# Patient Record
Sex: Female | Born: 1990 | Race: Black or African American | Hispanic: No | Marital: Single | Smoking: Current some day smoker
Health system: Southern US, Community
[De-identification: ages and names within clinical notes are randomized; demographics above are authoritative.]

## PROBLEM LIST (undated history)

## (undated) ENCOUNTER — Inpatient Hospital Stay: Payer: Self-pay

## (undated) DIAGNOSIS — D649 Anemia, unspecified: Secondary | ICD-10-CM

## (undated) DIAGNOSIS — Z3490 Encounter for supervision of normal pregnancy, unspecified, unspecified trimester: Secondary | ICD-10-CM

## (undated) DIAGNOSIS — J45909 Unspecified asthma, uncomplicated: Secondary | ICD-10-CM

## (undated) HISTORY — DX: Encounter for supervision of normal pregnancy, unspecified, unspecified trimester: Z34.90

---
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
## 2012-02-03 ENCOUNTER — Emergency Department: Payer: Self-pay | Admitting: Unknown Physician Specialty

## 2013-09-27 ENCOUNTER — Observation Stay: Payer: Self-pay

## 2013-09-27 LAB — URINALYSIS, COMPLETE
BLOOD: NEGATIVE
Bilirubin,UR: NEGATIVE
Glucose,UR: NEGATIVE mg/dL (ref 0–75)
Ketone: NEGATIVE
NITRITE: NEGATIVE
PROTEIN: NEGATIVE
Ph: 6 (ref 4.5–8.0)
RBC,UR: 1 /HPF (ref 0–5)
SPECIFIC GRAVITY: 1.012 (ref 1.003–1.030)
Squamous Epithelial: 17
WBC UR: 5 /HPF (ref 0–5)

## 2013-10-03 IMAGING — CR DG TOE 5TH LEFT
1 series · 3 of 3 positions shown · non-contrast
Comparison: none

REASON FOR EXAM: injury
COMMENTS:

PROCEDURE:     DXR - DXR TOE 5TH DIGIT LEFT FOOT  - February 03, 2012 [DATE]
RESULT:     Three views of the left fifth toe are submitted. The bones are
adequately mineralized. There is no evidence of an acute fracture nor
dislocation. The overlying soft tissues are normal in appearance.

[Series 1: x toes ap left · 0.14mm/px · 3 of 3 slices shown]
[im 1/3]
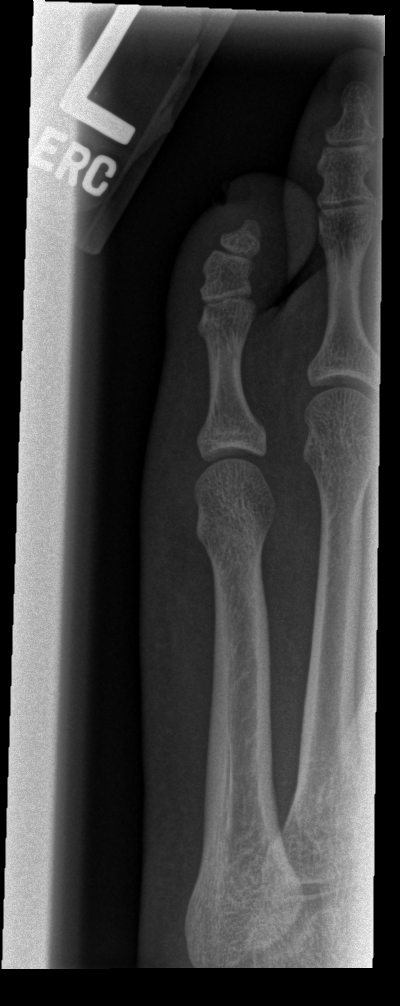
[im 2/3]
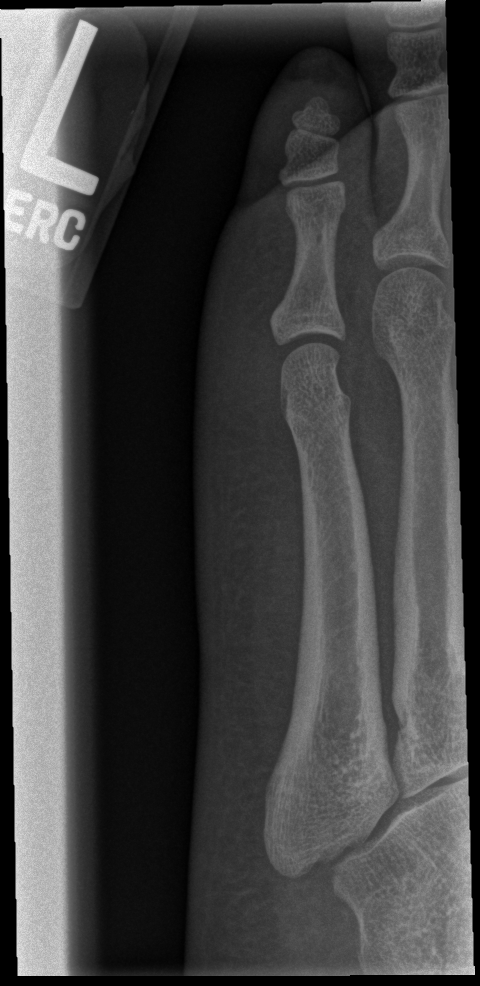
[im 3/3]
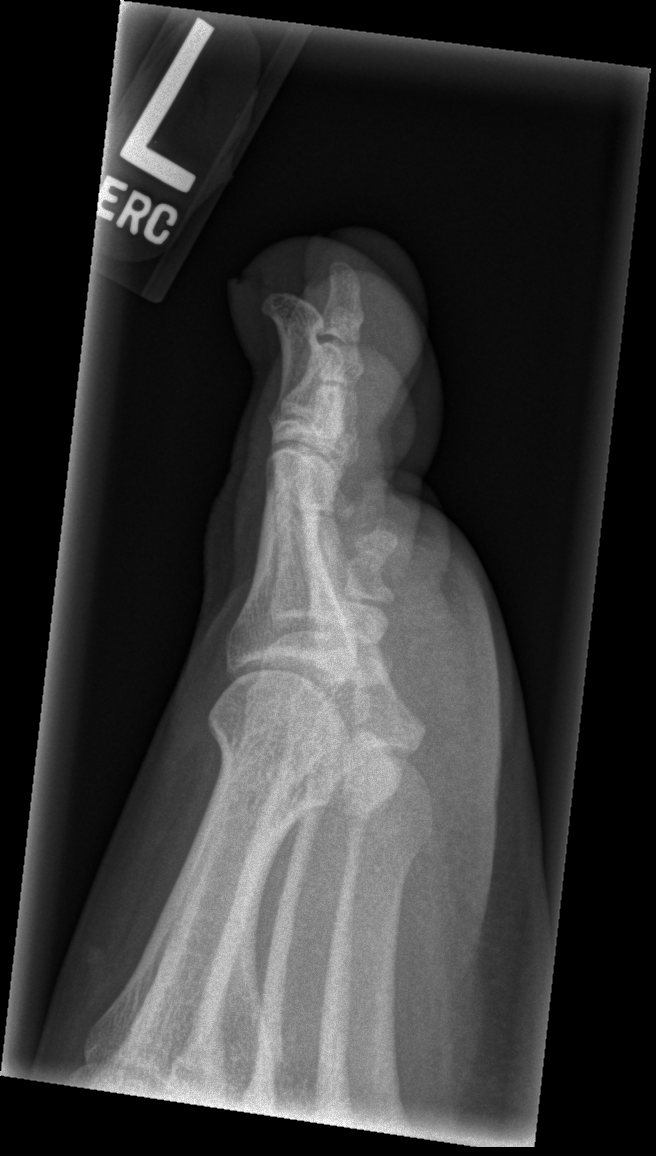

[3 of 3 positions shown; findings below may reference images not displayed]

IMPRESSION: There is no acute bony abnormality of the left fifth digit.
No soft tissue foreign bodies are demonstrated.

[REDACTED]

## 2013-10-04 IMAGING — CR RIGHT FOOT COMPLETE - 3+ VIEW
1 series · 3 of 3 positions shown · non-contrast
Comparison: none

REASON FOR EXAM: injury fall fb
COMMENTS:

[Series 1: ap · 0.17mm/px · 3 of 3 slices shown]
[im 1/3]
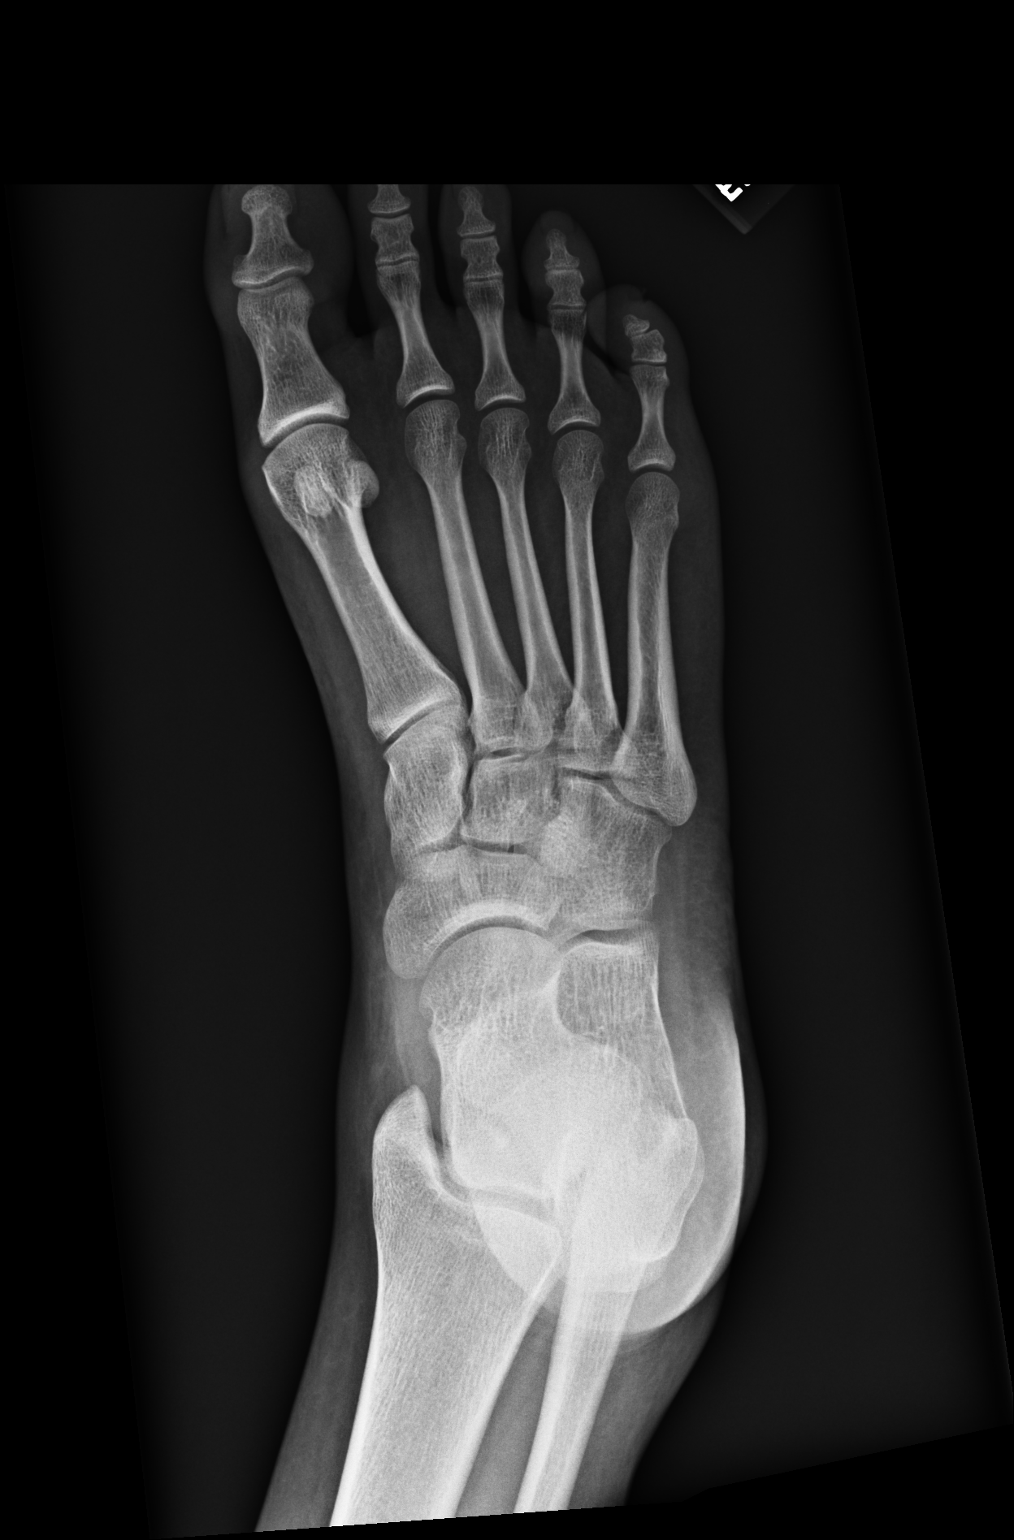
[im 2/3]
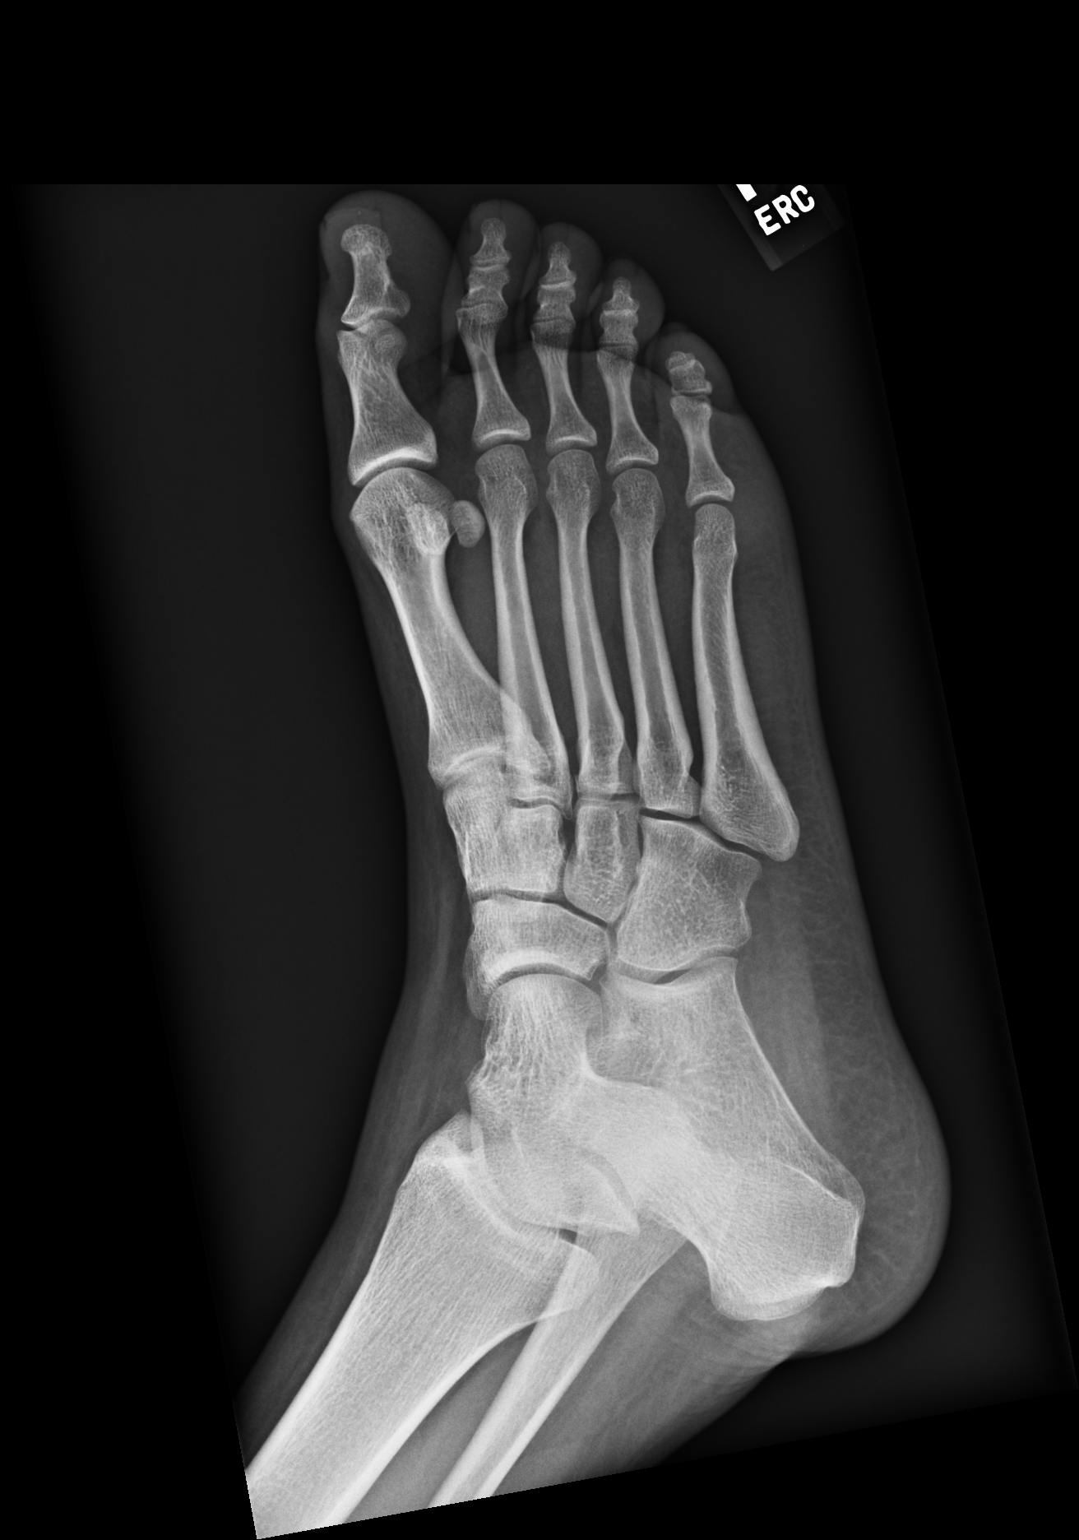
[im 3/3]
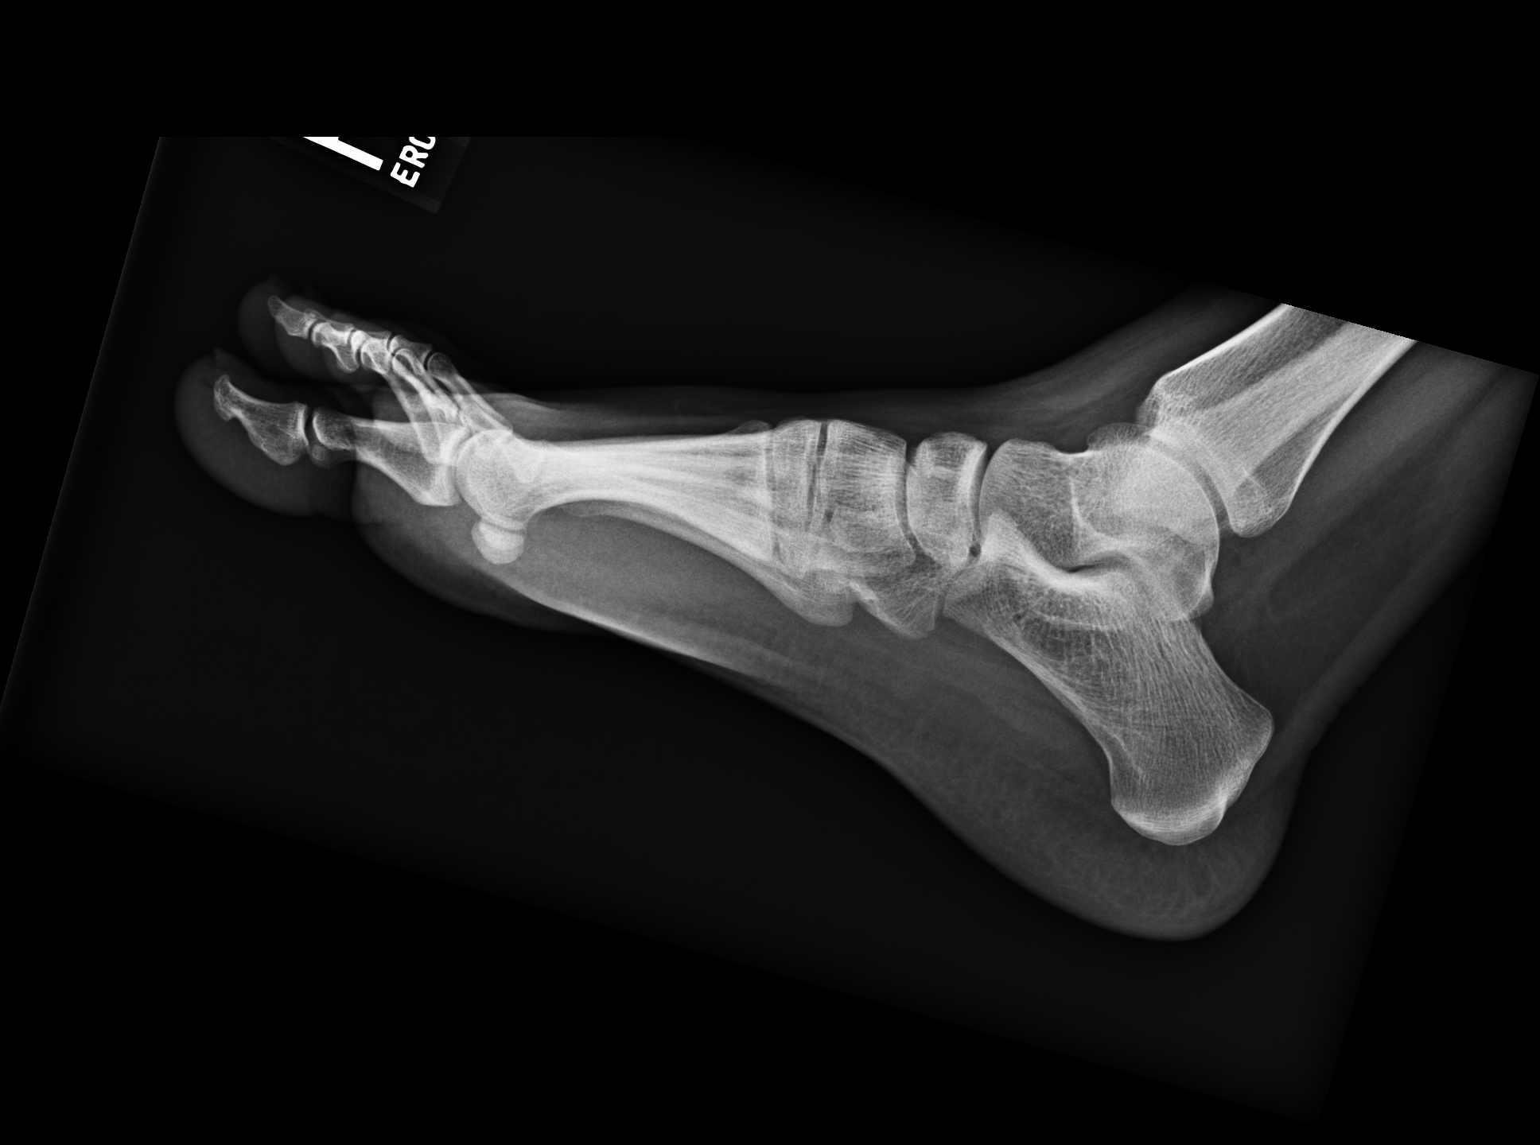

[3 of 3 positions shown; findings below may reference images not displayed]

PROCEDURE:     DXR - DXR FOOT RT COMPLETE W/OBLIQUES  - February 04, 2012  [DATE]

RESULT:     Three views of the right foot reveal the bones to be adequately
mineralized. There is no evidence of an acute fracture nor dislocation. No
radiopaque foreign bodies or soft tissue gas collections are demonstrated.
IMPRESSION: There is no acute bony abnormality of the right foot.

[REDACTED]

## 2014-10-26 NOTE — H&P (Signed)
L&D Evaluation:  History:  HPI 24 year old G2 106P1001 with EDC=08 Nov 2013 by a 18 week ultrasound per patient ( and 12 May per LMP) presents at 34 weeks with c/o constant pressure in lower abdomen x 2 days. Denies ctxs, VB, dysuria, vaginal discharge, diarrhea. Baby active. [facility]. No prenatal records available. History peer the patient and records from her C-section 2 years ago for [facility]. States that she has not had any problems thus far with the pregnancy (other than being treated for BV) and that she plans to make an appt to start her care soon her in [place].   Presents with lower abdominal pressure   Patient's Medical History Denies CMI, but mention of bipolar disorder in old chart   Patient's Surgical History Previous C-Section   Medications Pre Serbiaatal Vitamins  Tylenol (Acetaminophen)  antibiotic for BV   Allergies Amoxicillin (unknown reaction)   Social History Denies tobacco or drug use (past hx positive for both)   Family History Non-Contributory   ROS:  ROS see HPI   Exam:  Vital Signs 129/72   Urine Protein UA negative   General no apparent distress   Mental Status clear   Abdomen gravid, non-tender   Estimated Fetal Weight 4#9oz on US/ posterior and fundal placenta   Fetal Position cephalic   Edema no edema   Pelvic no external lesions, closed/L/-3 and ballotable   Mebranes Intact   FHT normal rate with no decels, 135 with accels to 150s-170s   FHT Description Cat 1   Ucx absent   Skin dry   Impression:  Impression reactive NST, IUP at 34 weeks with no evidence of labor or UTI   Plan:  Plan DC home . [facility] .   Electronic Signatures: Trinna BalloonGutierrez, Yanis Juma L (CNM)  (Signed 12-Apr-15 20:41)  Authored: L&D Evaluation   Last Updated: 12-Apr-15 20:41 by Trinna BalloonGutierrez, Lavena Loretto L (CNM)

## 2014-11-19 ENCOUNTER — Emergency Department
Admission: EM | Admit: 2014-11-19 | Discharge: 2014-11-20 | Disposition: A | Discharge status: Home / Self Care | Payer: Medicaid Other | Attending: Emergency Medicine | Admitting: Emergency Medicine

## 2014-11-19 ENCOUNTER — Encounter: Payer: Self-pay | Admitting: *Deleted

## 2014-11-19 DIAGNOSIS — F17210 Nicotine dependence, cigarettes, uncomplicated: Secondary | ICD-10-CM | POA: Insufficient documentation

## 2014-11-19 DIAGNOSIS — Z3A12 12 weeks gestation of pregnancy: Secondary | ICD-10-CM | POA: Insufficient documentation

## 2014-11-19 DIAGNOSIS — O99331 Smoking (tobacco) complicating pregnancy, first trimester: Secondary | ICD-10-CM | POA: Insufficient documentation

## 2014-11-19 DIAGNOSIS — F10929 Alcohol use, unspecified with intoxication, unspecified: Secondary | ICD-10-CM | POA: Insufficient documentation

## 2014-11-19 DIAGNOSIS — O99311 Alcohol use complicating pregnancy, first trimester: Secondary | ICD-10-CM | POA: Insufficient documentation

## 2014-11-19 DIAGNOSIS — F1410 Cocaine abuse, uncomplicated: Secondary | ICD-10-CM | POA: Insufficient documentation

## 2014-11-19 DIAGNOSIS — Z789 Other specified health status: Secondary | ICD-10-CM

## 2014-11-19 DIAGNOSIS — Z7289 Other problems related to lifestyle: Secondary | ICD-10-CM

## 2014-11-19 DIAGNOSIS — F1210 Cannabis abuse, uncomplicated: Secondary | ICD-10-CM | POA: Insufficient documentation

## 2014-11-19 DIAGNOSIS — O99321 Drug use complicating pregnancy, first trimester: Secondary | ICD-10-CM | POA: Insufficient documentation

## 2014-11-19 LAB — CBC
HCT: 34.4 % — ABNORMAL LOW (ref 35.0–47.0)
Hemoglobin: 11.2 g/dL — ABNORMAL LOW (ref 12.0–16.0)
MCH: 25.8 pg — ABNORMAL LOW (ref 26.0–34.0)
MCHC: 32.6 g/dL (ref 32.0–36.0)
MCV: 79.2 fL — ABNORMAL LOW (ref 80.0–100.0)
Platelets: 350 10*3/uL (ref 150–440)
RBC: 4.34 MIL/uL (ref 3.80–5.20)
RDW: 16.2 % — ABNORMAL HIGH (ref 11.5–14.5)
WBC: 8.8 10*3/uL (ref 3.6–11.0)

## 2014-11-19 LAB — TYPE AND SCREEN
ABO/RH(D): O POS
Antibody Screen: NEGATIVE

## 2014-11-19 LAB — COMPREHENSIVE METABOLIC PANEL
ALBUMIN: 3.7 g/dL (ref 3.5–5.0)
ALK PHOS: 48 U/L (ref 38–126)
ALT: 20 U/L (ref 14–54)
AST: 23 U/L (ref 15–41)
Anion gap: 11 (ref 5–15)
BUN: 12 mg/dL (ref 6–20)
CALCIUM: 8.6 mg/dL — AB (ref 8.9–10.3)
CO2: 20 mmol/L — AB (ref 22–32)
Chloride: 109 mmol/L (ref 101–111)
Creatinine, Ser: 1.2 mg/dL — ABNORMAL HIGH (ref 0.44–1.00)
GFR calc Af Amer: >60 mL/min (ref >60)
GFR calc non Af Amer: >60 mL/min (ref >60)
Glucose, Bld: 104 mg/dL — ABNORMAL HIGH (ref 65–99)
Potassium: 3.3 mmol/L — ABNORMAL LOW (ref 3.5–5.1)
SODIUM: 140 mmol/L (ref 135–145)
Total Bilirubin: 0.1 mg/dL — ABNORMAL LOW (ref 0.3–1.2)
Total Protein: 7.4 g/dL (ref 6.5–8.1)

## 2014-11-19 LAB — ETHANOL: Alcohol, Ethyl (B): 359 mg/dL (ref <5)

## 2014-11-19 LAB — SALICYLATE LEVEL: Salicylate Lvl: <4 mg/dL (ref 2.8–30.0)

## 2014-11-19 LAB — ACETAMINOPHEN LEVEL

## 2014-11-19 NOTE — ED Provider Notes (Signed)
[facility] Emergency Department Provider Note  Time seen: 10:55 PM  I have reviewed the triage vital signs and the nursing notes.   HISTORY  Chief Complaint Medical Clearance and Alcohol Intoxication    HPI Barbara Boone is a 24 y.o. female with no known past medical history presents the emergency department by [facility] police after being arrested for driving while intoxicated. According to the police report the patient over for driving erratically, patient admits cocaine and [place] use. The patient is 2 to 4 months pregnant. They brought the patient into the emergency department under an involuntary commitment for further evaluation and treatment of substance abuse. Upon arrival the patient appears intoxicated with slurred speech, initially yelling. Denies any medical problems. Denies vaginal bleeding or fluid leakage. Denies any OB care. Patient is not exactly sure how far along she is. Denies any abdominal pain.    History reviewed. No pertinent past medical history.  There are no active problems to display for this patient.   History reviewed. No pertinent past surgical history.  No current outpatient prescriptions on file.  Allergies Review of patient's allergies indicates no known allergies.  History reviewed. No pertinent family history.  Social History History  Substance Use Topics  . Smoking status: Current Some Day Smoker    Types: Cigarettes  . Smokeless tobacco: Never Used  . Alcohol Use: Yes     Comment: Unknown, presently intoxicated w/ ETOH    Review of Systems Constitutional: Negative for fever. Cardiovascular: Negative for chest pain. Respiratory: Negative for shortness of breath. Gastrointestinal: Negative for abdominal pai Genitourinary: Negative for vaginal bleeding. 10-point ROS otherwise negative.  ____________________________________________   PHYSICAL EXAM:  VITAL SIGNS: ED Triage Vitals  Enc Vitals Group    BP 11/19/14 2130 130/91 mmHg     Pulse Rate 11/19/14 2130 115     Resp 11/19/14 2130 20     Temp 11/19/14 2130 98.2 F (36.8 C)     Temp Source 11/19/14 2130 Axillary     SpO2 11/19/14 2130 100 %     Weight 11/19/14 2130 190 lb (86.183 kg)     Height 11/19/14 2130  (1.702 m)     Head Cir --      Peak Flow --      Pain Score --      Pain Loc --      Pain Edu? --      Excl. in GC? --     Constitutional: Alert and oriented. Intoxicated with slurred speech. ENT   Head: Normocephalic and atraumatic   Mouth/Throat: Mucous membranes are moist. Cardiovascular: Normal rate, regular rhythm. Respiratory: Normal respiratory effort without tachypnea nor retractions. Breath sounds are clear  Gastrointestinal: Soft and nontender. No distention.   Musculoskeletal: Nontender with normal range of motion in all extremities.  Neurologic:  Normal speech and language. No gross focal neurologic deficits  Skin:  Skin is warm, dry and intact.  Psychiatric: Slurred speech, appears intoxicated, patient is cooperative at this time.  ____________________________________________   INITIAL IMPRESSION / ASSESSMENT AND PLAN / ED COURSE  Pertinent labs & imaging results that were available during my care of the patient were reviewed by me and considered in my medical decision making (see chart for details).  Patient here following a DWI, likely pregnant, likely intoxicated. We will keep the patient under an involuntary commitment until the patient can be properly evaluated by psychiatry, and hopefully will be open to substance abuse treatment.  ____________________________________________  FINAL CLINICAL IMPRESSION(S) / ED DIAGNOSES  Substance abuse   Minna AntisKevin Alexandera Kuntzman, MD 11/19/14 2300

## 2014-11-19 NOTE — ED Notes (Addendum)
Pt threw hat out of toilet and refused to give urine sample stating "I am not being drug tested and taken to jail for it"

## 2014-11-19 NOTE — ED Notes (Signed)
Pt in [employer] custody for forensic blood draw. Pt is hysterically crying and inconsolable. Pt smells strongly of ETOH and admits to use. Pt reports that she is pregnant.

## 2014-11-20 ENCOUNTER — Encounter: Payer: Self-pay | Admitting: *Deleted

## 2014-11-20 LAB — URINE DRUG SCREEN, QUALITATIVE (ARMC ONLY)
Amphetamines, Ur Screen: NOT DETECTED
Barbiturates, Ur Screen: NOT DETECTED
Benzodiazepine, Ur Scrn: NOT DETECTED
CANNABINOID 50 NG, UR ~~LOC~~: POSITIVE — AB
COCAINE METABOLITE, UR ~~LOC~~: POSITIVE — AB
MDMA (ECSTASY) UR SCREEN: NOT DETECTED
METHADONE SCREEN, URINE: NOT DETECTED
OPIATE, UR SCREEN: NOT DETECTED
Phencyclidine (PCP) Ur S: NOT DETECTED
Tricyclic, Ur Screen: NOT DETECTED

## 2014-11-20 LAB — URINALYSIS COMPLETE WITH MICROSCOPIC (ARMC ONLY)
BACTERIA UA: NONE SEEN
BILIRUBIN URINE: NEGATIVE
GLUCOSE, UA: NEGATIVE mg/dL
HGB URINE DIPSTICK: NEGATIVE
LEUKOCYTES UA: NEGATIVE
NITRITE: NEGATIVE
PROTEIN: NEGATIVE mg/dL
SPECIFIC GRAVITY, URINE: 1.017 (ref 1.005–1.030)
pH: 5 (ref 5.0–8.0)

## 2014-11-20 LAB — ABO/RH: ABO/RH(D): O POS

## 2014-11-20 LAB — HCG, QUANTITATIVE, PREGNANCY: HCG, BETA CHAIN, QUANT, S: 145985 m[IU]/mL — AB (ref <5)

## 2014-11-20 LAB — POCT PREGNANCY, URINE: PREG TEST UR: POSITIVE — AB

## 2014-11-20 MED ORDER — ACETAMINOPHEN 325 MG PO TABS
ORAL_TABLET | ORAL | Status: AC
  Administered 2014-11-20: 650 mg via ORAL
  Filled 2014-11-20: qty 2

## 2014-11-20 MED ORDER — PRENATAL MULTIVITAMIN CH
1.0000 | ORAL_TABLET | Freq: Every day | ORAL | Status: DC
  Administered 2014-11-20: 1 via ORAL
  Filled 2014-11-20: qty 1

## 2014-11-20 MED ORDER — ACETAMINOPHEN 325 MG PO TABS
650.0000 mg | ORAL_TABLET | Freq: Once | ORAL | Status: AC
  Administered 2014-11-20: 650 mg via ORAL

## 2014-11-20 NOTE — ED Notes (Signed)
BEHAVIORAL HEALTH ROUNDING Patient sleeping: No. Patient alert and oriented: yes Behavior appropriate: Yes.  ; If no, describe:  Nutrition and fluids offered: Yes  Toileting and hygiene offered: Yes  Sitter present: no Law enforcement present: Yes ODS 

## 2014-11-20 NOTE — ED Notes (Signed)
Psych / TTS consult completed plan to discharge patient/Will rescind the IVC

## 2014-11-20 NOTE — Discharge Instructions (Signed)
Please seek medical attention for any high fevers, chest pain, shortness of breath, change in behavior, persistent vomiting, bloody stool or any other new or concerning symptoms.  Alcohol Use Disorder Alcohol use disorder is a mental disorder. It is not a one-time incident of heavy drinking. Alcohol use disorder is the excessive and uncontrollable use of alcohol over time that leads to problems with functioning in one or more areas of daily living. People with this disorder risk harming themselves and others when they drink to excess. Alcohol use disorder also can cause other mental disorders, such as mood and anxiety disorders, and serious physical problems. People with alcohol use disorder often misuse other drugs.  Alcohol use disorder is common and widespread. Some people with this disorder drink alcohol to cope with or escape from negative life events. Others drink to relieve chronic pain or symptoms of mental illness. People with a family history of alcohol use disorder are at higher risk of losing control and using alcohol to excess.  SYMPTOMS  Signs and symptoms of alcohol use disorder may include the following:   Consumption ofalcohol inlarger amounts or over a longer period of time than intended.  Multiple unsuccessful attempts to cutdown or control alcohol use.   A great deal of time spent obtaining alcohol, using alcohol, or recovering from the effects of alcohol (hangover).  A strong desire or urge to use alcohol (cravings).   Continued use of alcohol despite problems at work, school, or home because of alcohol use.   Continued use of alcohol despite problems in relationships because of alcohol use.  Continued use of alcohol in situations when it is physically hazardous, such as driving a car.  Continued use of alcohol despite awareness of a physical or psychological problem that is likely related to alcohol use. Physical problems related to alcohol use can involve the brain,  heart, liver, stomach, and intestines. Psychological problems related to alcohol use include intoxication, depression, anxiety, psychosis, delirium, and dementia.   The need for increased amounts of alcohol to achieve the same desired effect, or a decreased effect from the consumption of the same amount of alcohol (tolerance).  Withdrawal symptoms upon reducing or stopping alcohol use, or alcohol use to reduce or avoid withdrawal symptoms. Withdrawal symptoms include:  Racing heart.  Hand tremor.  Difficulty sleeping.  Nausea.  Vomiting.  Hallucinations.  Restlessness.  Seizures. DIAGNOSIS Alcohol use disorder is diagnosed through an assessment by your health care provider. Your health care provider may start by asking three or four questions to screen for excessive or problematic alcohol use. To confirm a diagnosis of alcohol use disorder, at least two symptoms must be present within a 5872-month period. The severity of alcohol use disorder depends on the number of symptoms:  Mild--two or three.  Moderate--four or five.  Severe--six or more. Your health care provider may perform a physical exam or use results from lab tests to see if you have physical problems resulting from alcohol use. Your health care provider may refer you to a mental health professional for evaluation. TREATMENT  Some people with alcohol use disorder are able to reduce their alcohol use to low-risk levels. Some people with alcohol use disorder need to quit drinking alcohol. When necessary, mental health professionals with specialized training in substance use treatment can help. Your health care provider can help you decide how severe your alcohol use disorder is and what type of treatment you need. The following forms of treatment are available:   Detoxification.  Detoxification involves the use of prescription medicines to prevent alcohol withdrawal symptoms in the first week after quitting. This is important  for people with a history of symptoms of withdrawal and for heavy drinkers who are likely to have withdrawal symptoms. Alcohol withdrawal can be dangerous and, in severe cases, cause death. Detoxification is usually provided in a hospital or in-patient substance use treatment facility.  Counseling or talk therapy. Talk therapy is provided by substance use treatment counselors. It addresses the reasons people use alcohol and ways to keep them from drinking again. The goals of talk therapy are to help people with alcohol use disorder find healthy activities and ways to cope with life stress, to identify and avoid triggers for alcohol use, and to handle cravings, which can cause relapse.  Medicines.Different medicines can help treat alcohol use disorder through the following actions:  Decrease alcohol cravings.  Decrease the positive reward response felt from alcohol use.  Produce an uncomfortable physical reaction when alcohol is used (aversion therapy).  Support groups. Support groups are run by people who have quit drinking. They provide emotional support, advice, and guidance. These forms of treatment are often combined. Some people with alcohol use disorder benefit from intensive combination treatment provided by specialized substance use treatment centers. Both inpatient and outpatient treatment programs are available. Document Released: 07/12/2004 Document Revised: 10/19/2013 Document Reviewed: 09/11/2012 [facility] Patient Information 2015 [employer] [place]. This information is not intended to replace advice given to you by your health care provider. Make sure you discuss any questions you have with your health care provider.

## 2014-11-20 NOTE — BH Assessment (Signed)
LATE ENTRY-------Spoke with pt. mother (Angelia) via phone. Pt. provided Clinical research associatewriter the number. It was dialed and conversation was had in front of the pt.  Writer inquired about what she knows about what happened that lead to the pt. being brought to the ER. Mother stated, "All I know, is the police brought her keys to the house and said she was acting crazy. She was banging her head on the grown and fighting us. She was taking to the ER to get checked out." Writer further asked the mother, if she had concerns for the pt. safety and she initially stated yes. The nature of her concerns were related to her being pregnant and her substance use. Pt. has a 22-year-old daughter that is in her custody. Writer asked about concerns of safety for her. Mother reported none, due to being the primary caregiver for the pt. When pt. is "out drinking and carrying on, I have her (patient's daughter)."

## 2014-11-20 NOTE — ED Notes (Signed)
BEHAVIORAL HEALTH ROUNDING Patient sleeping: Yes.   Patient alert and oriented: yes Behavior appropriate: Yes.  ;  Nutrition and fluids offered: Yes  Toileting and hygiene offered: Yes  Sitter present: yes Law enforcement present: Yes  

## 2014-11-20 NOTE — ED Notes (Signed)
BEHAVIORAL HEALTH ROUNDING Patient sleeping: Yes.   Patient alert and oriented: not applicable Behavior appropriate: Yes.  ; If no, describe:   Nutrition and fluids offered: No Toileting and hygiene offered: No Sitter present: no Law enforcement present: Yes  and ODS  ENVIRONMENTAL ASSESSMENT Potentially harmful objects out of patient reach: Yes.   Personal belongings secured: Yes.   Patient dressed in hospital provided attire only: Yes.   Plastic bags out of patient reach: Yes.   Patient care equipment (cords, cables, call bells, lines, and drains) shortened, removed, or accounted for: Yes.   Equipment and supplies removed from bottom of stretcher: Yes.   Potentially toxic materials out of patient reach: Yes.   Sharps container removed or out of patient reach: Yes.    

## 2014-11-20 NOTE — ED Notes (Signed)
Pt given dinner tray at this time, sitting up in bed eating, tolerating well. Calm cooperative and no issues at this time.

## 2014-11-20 NOTE — ED Notes (Signed)
BEHAVIORAL HEALTH ROUNDING Patient sleeping: Yes.   Patient alert and oriented: not applicable Behavior appropriate: Yes.  ; If no, describe:   Nutrition and fluids offered: No Toileting and hygiene offered: No Sitter present: no Law enforcement present: Yes  and ODS    

## 2014-11-20 NOTE — [facility] Counselor (Signed)
LATE ENTRY------Per request of Psych MD, Dr. Challa, writer provided the pt. with information and instructions on how to access Out Pt. Mental Health Treatment ([facility])   
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
## 2014-11-20 NOTE — ED Provider Notes (Signed)
-----------------------------------------   4:09 PM on 11/20/2014 -----------------------------------------  Psychiatry has seen patient. They have rescinded the IVC. Patient will follow up with [facility].  Phineas SemenGraydon Meliana Canner, MD 11/20/14 [phone]

## 2014-11-20 NOTE — ED Notes (Signed)
CSW speaking with pt at present

## 2014-11-20 NOTE — ED Notes (Signed)

## 2014-11-20 NOTE — ED Notes (Signed)
Visitor with pt.

## 2014-11-20 NOTE — ED Provider Notes (Signed)
-----------------------------------------   7:14 AM on 11/20/2014 -----------------------------------------   BP 130/91 mmHg  Pulse 115  Temp(Src) 98.2 F (36.8 C) (Axillary)  Resp 20  Ht 5\' 7"  (1.702 m)  Wt 190 lb (86.183 kg)  BMI 29.75 kg/m2  SpO2 100%  LMP  (LMP Unknown)  The patient had no acute events since last update.  Calm and cooperative at this time.  Disposition is pending per Psychiatry/Behavioral Medicine team recommendations.     Sharman CheekPhillip Tevis Conger, MD 11/20/14 [phone]

## 2014-11-20 NOTE — ED Notes (Signed)
Psychiatrist with pt at present for consult.

## 2014-11-20 NOTE — ED Notes (Signed)
BEHAVIORAL HEALTH ROUNDING Patient sleeping: No. Patient alert and oriented: yes Behavior appropriate: Yes.  ; If no, describe:  Nutrition and fluids offered: Yes  Toileting and hygiene offered: Yes  Sitter present: no Law enforcement present: Yes ODs 

## 2014-11-20 NOTE — ED Notes (Signed)
Pt to toilet to provided urine specimen

## 2015-02-21 ENCOUNTER — Encounter: Payer: Self-pay | Admitting: Emergency Medicine

## 2015-02-21 ENCOUNTER — Emergency Department: Payer: Medicaid Other

## 2015-02-21 ENCOUNTER — Emergency Department
Admission: EM | Admit: 2015-02-21 | Discharge: 2015-02-21 | Disposition: A | Discharge status: Home / Self Care | Payer: Medicaid Other | Attending: Emergency Medicine | Admitting: Emergency Medicine

## 2015-02-21 DIAGNOSIS — Z3A00 Weeks of gestation of pregnancy not specified: Secondary | ICD-10-CM | POA: Insufficient documentation

## 2015-02-21 DIAGNOSIS — Y9389 Activity, other specified: Secondary | ICD-10-CM | POA: Diagnosis not present

## 2015-02-21 DIAGNOSIS — Y998 Other external cause status: Secondary | ICD-10-CM | POA: Diagnosis not present

## 2015-02-21 DIAGNOSIS — O99330 Smoking (tobacco) complicating pregnancy, unspecified trimester: Secondary | ICD-10-CM | POA: Insufficient documentation

## 2015-02-21 DIAGNOSIS — O9A219 Injury, poisoning and certain other consequences of external causes complicating pregnancy, unspecified trimester: Secondary | ICD-10-CM | POA: Insufficient documentation

## 2015-02-21 DIAGNOSIS — Y92410 Unspecified street and highway as the place of occurrence of the external cause: Secondary | ICD-10-CM | POA: Insufficient documentation

## 2015-02-21 DIAGNOSIS — F17210 Nicotine dependence, cigarettes, uncomplicated: Secondary | ICD-10-CM | POA: Diagnosis not present

## 2015-02-21 DIAGNOSIS — Z880 Allergy status to penicillin: Secondary | ICD-10-CM | POA: Diagnosis not present

## 2015-02-21 DIAGNOSIS — S8012XA Contusion of left lower leg, initial encounter: Secondary | ICD-10-CM | POA: Diagnosis not present

## 2015-02-21 LAB — COMPREHENSIVE METABOLIC PANEL
ALK PHOS: 56 U/L (ref 38–126)
ALT: 17 U/L (ref 14–54)
AST: 31 U/L (ref 15–41)
Albumin: 3.2 g/dL — ABNORMAL LOW (ref 3.5–5.0)
Anion gap: 8 (ref 5–15)
BILIRUBIN TOTAL: 0.3 mg/dL (ref 0.3–1.2)
BUN: 7 mg/dL (ref 6–20)
CALCIUM: 8.5 mg/dL — AB (ref 8.9–10.3)
CO2: 22 mmol/L (ref 22–32)
CREATININE: 0.55 mg/dL (ref 0.44–1.00)
Chloride: 105 mmol/L (ref 101–111)
Glucose, Bld: 118 mg/dL — ABNORMAL HIGH (ref 65–99)
Potassium: 3.2 mmol/L — ABNORMAL LOW (ref 3.5–5.1)
Sodium: 135 mmol/L (ref 135–145)
Total Protein: 6.6 g/dL (ref 6.5–8.1)

## 2015-02-21 LAB — CBC WITH DIFFERENTIAL/PLATELET
Basophils Absolute: 0 10*3/uL (ref 0–0.1)
Basophils Relative: 0 %
Eosinophils Absolute: 0.1 10*3/uL (ref 0–0.7)
Eosinophils Relative: 1 %
HCT: 30.7 % — ABNORMAL LOW (ref 35.0–47.0)
HEMOGLOBIN: 10 g/dL — AB (ref 12.0–16.0)
LYMPHS ABS: 1.8 10*3/uL (ref 1.0–3.6)
LYMPHS PCT: 21 %
MCH: 26.6 pg (ref 26.0–34.0)
MCHC: 32.5 g/dL (ref 32.0–36.0)
MCV: 81.8 fL (ref 80.0–100.0)
Monocytes Absolute: 0.6 10*3/uL (ref 0.2–0.9)
Monocytes Relative: 7 %
NEUTROS PCT: 71 %
Neutro Abs: 6.1 10*3/uL (ref 1.4–6.5)
Platelets: 280 10*3/uL (ref 150–440)
RBC: 3.75 MIL/uL — AB (ref 3.80–5.20)
RDW: 15.7 % — ABNORMAL HIGH (ref 11.5–14.5)
WBC: 8.7 10*3/uL (ref 3.6–11.0)

## 2015-02-21 NOTE — ED Notes (Signed)
Reports swelling to LLE.  5" area noted below knee, bruising, swelling and redness noted

## 2015-02-21 NOTE — ED Notes (Signed)
Pt verbalizes understanding of the instructions and follow up.
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
## 2015-02-21 NOTE — Discharge Instructions (Signed)
You were evaluated and found to have a large deep bruise of the leg, called hematoma. Return to the emergency department for any worsening pain, numbness, tingling of the foot, new redness or skin rash, or any systemic symptoms such as fever, nausea, vomiting, dizziness, or passing out.  You may take over-the-counter Tylenol as needed for pain, as this is the only safe medication during pregnancy.  You may continue alternating heating pad and ice to help facilitate and encouraged the breakdown and resorption of the bruise.   Hematoma A hematoma is a collection of blood. The collection of blood can turn into a hard, painful lump under the skin. Your skin may turn blue or yellow if the hematoma is close to the surface of the skin. Most hematomas get better in a few days to weeks. Some hematomas are serious and need medical care. Hematomas can be very small or very big. HOME CARE  Apply ice to the injured area:  Put ice in a plastic bag.  Place a towel between your skin and the bag.  Leave the ice on for 20 minutes, 2-3 times a day for the first 1 to 2 days.  After the first 2 days, switch to using warm packs on the injured area.  Raise (elevate) the injured area to lessen pain and puffiness (swelling). You may also wrap the area with an elastic bandage. Make sure the bandage is not wrapped too tight.  If you have a painful hematoma on your leg or foot, you may use crutches for a couple days.  Only take medicines as told by your doctor. GET HELP RIGHT AWAY IF:   Your pain gets worse.  Your pain is not controlled with medicine.  You have a fever.  Your puffiness gets worse.  Your skin turns more blue or yellow.  Your skin over the hematoma breaks or starts bleeding.  Your hematoma is in your chest or belly (abdomen) and you are short of breath, feel weak, or have a change in consciousness.  Your hematoma is on your scalp and you have a headache that gets worse or a change in  alertness or consciousness. MAKE SURE YOU:   Understand these instructions.  Will watch your condition.  Will get help right away if you are not doing well or get worse. Document Released: 07/12/2004 Document Revised: 02/04/2013 Document Reviewed: 11/12/2012 [facility] Patient Information 2015 [place], [place]. This information is not intended to replace advice given to you by your health care provider. Make sure you discuss any questions you have with your health care provider.

## 2015-05-18 ENCOUNTER — Observation Stay
Admission: EM | Admit: 2015-05-18 | Discharge: 2015-05-18 | Disposition: A | Discharge status: Home / Self Care | Payer: Medicaid Other | Attending: Obstetrics & Gynecology | Admitting: Obstetrics & Gynecology

## 2015-05-18 DIAGNOSIS — O26893 Other specified pregnancy related conditions, third trimester: Secondary | ICD-10-CM | POA: Diagnosis not present

## 2015-05-18 DIAGNOSIS — Z3A36 36 weeks gestation of pregnancy: Secondary | ICD-10-CM | POA: Insufficient documentation

## 2015-05-18 DIAGNOSIS — R109 Unspecified abdominal pain: Secondary | ICD-10-CM | POA: Insufficient documentation

## 2015-05-18 DIAGNOSIS — F1910 Other psychoactive substance abuse, uncomplicated: Secondary | ICD-10-CM | POA: Diagnosis not present

## 2015-05-18 DIAGNOSIS — O479 False labor, unspecified: Secondary | ICD-10-CM | POA: Diagnosis present

## 2015-05-18 LAB — URINALYSIS COMPLETE WITH MICROSCOPIC (ARMC ONLY)
BILIRUBIN URINE: NEGATIVE
GLUCOSE, UA: NEGATIVE mg/dL
HGB URINE DIPSTICK: NEGATIVE
Ketones, ur: NEGATIVE mg/dL
NITRITE: NEGATIVE
Protein, ur: NEGATIVE mg/dL
SPECIFIC GRAVITY, URINE: 1.017 (ref 1.005–1.030)
pH: 6 (ref 5.0–8.0)

## 2015-05-18 LAB — URINE DRUG SCREEN, QUALITATIVE (ARMC ONLY)
Amphetamines, Ur Screen: NOT DETECTED
BARBITURATES, UR SCREEN: NOT DETECTED
Benzodiazepine, Ur Scrn: NOT DETECTED
CANNABINOID 50 NG, UR ~~LOC~~: NOT DETECTED
COCAINE METABOLITE, UR ~~LOC~~: POSITIVE — AB
MDMA (Ecstasy)Ur Screen: NOT DETECTED
Methadone Scn, Ur: NOT DETECTED
OPIATE, UR SCREEN: NOT DETECTED
Phencyclidine (PCP) Ur S: NOT DETECTED
Tricyclic, Ur Screen: NOT DETECTED

## 2015-05-18 MED ORDER — ONDANSETRON HCL 4 MG/2ML IJ SOLN: 4.0000 mg | Freq: Four times a day (QID) | INTRAMUSCULAR | Status: DC | PRN

## 2015-05-18 MED ORDER — ACETAMINOPHEN 325 MG PO TABS: 650.0000 mg | ORAL_TABLET | ORAL | Status: DC | PRN

## 2015-05-18 NOTE — Discharge Instructions (Signed)
Drink plenty of fluids, use a heating pad or bath for lower back pain and take Tylenol as needed for pain. Please contact us or your doctor for further questions

## 2015-05-18 NOTE — OB Triage Note (Signed)
Pt oriented to obs rm 3, here for lower abdominal pain and back contractions starting 2 weeks ago. Will cont to monitor.

## 2015-05-18 NOTE — Final Progress Note (Signed)
Physician Final Progress Note  Patient ID: Barbara Boone MRN: [id number] DOB/AGE: [birth year] 24 y.o.  Admit date: 05/18/2015 Admitting provider: Nadara Mustardobert P Barbara Lippert, MD Discharge date: 05/18/2015   Admission Diagnoses: Abdominal Pain  Discharge Diagnoses:  Active Problems:   Irregular contractions   Abdominal pain   Third trimester   Substance abuse   Consults: None  Significant Findings/ Diagnostic Studies: labs: UA neg WBC despite + leukocystes;  + Cocaine  Procedures: A NST procedure was performed with FHR monitoring and a normal baseline established, appropriate time of 20-40 minutes of evaluation, and accels >2 seen w 15x15 characteristics.  Results show a REACTIVE NST.   Pt seen, examined, and Toco/FHR tested.    Gen NAD    Abd NT, gravid    Back No CVAT    SVE Cl/Th/Hi    Extr no edema Toco, no ctx Counseled on risks of drug use and pain/ctxs/PTL/Abruption.  Uterine irritability from dehydration, stress, activity also a cause.  Discharge Condition: good  Disposition: 01-Home or Self Care  Diet: Regular diet  Discharge Activity: Activity as tolerated  Needs appt.    Medication List    ASK your doctor about these medications        multivitamin-prenatal 27-0.8 MG Tabs tablet  Take 1 tablet by mouth daily at 12 noon.         Total time spent taking care of this patient: 15 minutes  Signed: Jsoeph Podesta PAUL 05/18/2015, 3:02 PM

## 2015-06-08 ENCOUNTER — Encounter
Admission: RE | Admit: 2015-06-08 | Discharge: 2015-06-08 | Disposition: A | Discharge status: Home / Self Care | Payer: Medicaid Other | Source: Ambulatory Visit | Attending: Obstetrics and Gynecology | Admitting: Obstetrics and Gynecology

## 2015-06-08 HISTORY — DX: Anemia, unspecified: D64.9

## 2015-06-08 HISTORY — DX: Unspecified asthma, uncomplicated: J45.909

## 2015-06-08 LAB — CBC
HEMATOCRIT: 32.7 % — AB (ref 35.0–47.0)
HEMOGLOBIN: 10.4 g/dL — AB (ref 12.0–16.0)
MCH: 25.4 pg — ABNORMAL LOW (ref 26.0–34.0)
MCHC: 31.8 g/dL — ABNORMAL LOW (ref 32.0–36.0)
MCV: 79.7 fL — ABNORMAL LOW (ref 80.0–100.0)
Platelets: 278 10*3/uL (ref 150–440)
RBC: 4.1 MIL/uL (ref 3.80–5.20)
RDW: 15.2 % — AB (ref 11.5–14.5)
WBC: 7.3 10*3/uL (ref 3.6–11.0)

## 2015-06-08 LAB — TYPE AND SCREEN
ABO/RH(D): O POS
ANTIBODY SCREEN: NEGATIVE
Extend sample reason: UNDETERMINED

## 2015-06-08 LAB — RAPID HIV SCREEN (HIV 1/2 AB+AG)
HIV 1/2 ANTIBODIES: NONREACTIVE
HIV-1 P24 Antigen - HIV24: NONREACTIVE

## 2015-06-08 LAB — ABO/RH: ABO/RH(D): O POS

## 2015-06-09 ENCOUNTER — Encounter
Admission: RE | Disposition: A | Discharge status: Home / Self Care | Payer: Self-pay | Source: Ambulatory Visit | Attending: Obstetrics and Gynecology

## 2015-06-09 ENCOUNTER — Inpatient Hospital Stay
Admission: RE | Admit: 2015-06-09 | Discharge: 2015-06-11 | DRG: 766 | Disposition: A | Discharge status: Home / Self Care | Payer: Medicaid Other | Source: Ambulatory Visit | Attending: Obstetrics and Gynecology | Admitting: Obstetrics and Gynecology

## 2015-06-09 ENCOUNTER — Inpatient Hospital Stay: Payer: Medicaid Other | Admitting: Anesthesiology

## 2015-06-09 DIAGNOSIS — Z3A39 39 weeks gestation of pregnancy: Secondary | ICD-10-CM | POA: Diagnosis not present

## 2015-06-09 DIAGNOSIS — O34219 Maternal care for unspecified type scar from previous cesarean delivery: Secondary | ICD-10-CM | POA: Diagnosis present

## 2015-06-09 DIAGNOSIS — O09893 Supervision of other high risk pregnancies, third trimester: Secondary | ICD-10-CM

## 2015-06-09 DIAGNOSIS — O0993 Supervision of high risk pregnancy, unspecified, third trimester: Secondary | ICD-10-CM

## 2015-06-09 DIAGNOSIS — O99323 Drug use complicating pregnancy, third trimester: Secondary | ICD-10-CM | POA: Diagnosis present

## 2015-06-09 DIAGNOSIS — Z98891 History of uterine scar from previous surgery: Secondary | ICD-10-CM

## 2015-06-09 LAB — RPR: RPR Ser Ql: NONREACTIVE

## 2015-06-09 LAB — CHLAMYDIA/NGC RT PCR (ARMC ONLY)
CHLAMYDIA TR: NOT DETECTED
N GONORRHOEAE: NOT DETECTED

## 2015-06-09 LAB — URINE DRUG SCREEN, QUALITATIVE (ARMC ONLY)
AMPHETAMINES, UR SCREEN: NOT DETECTED
BENZODIAZEPINE, UR SCRN: NOT DETECTED
Barbiturates, Ur Screen: NOT DETECTED
COCAINE METABOLITE, UR ~~LOC~~: NOT DETECTED
Cannabinoid 50 Ng, Ur ~~LOC~~: NOT DETECTED
MDMA (Ecstasy)Ur Screen: NOT DETECTED
METHADONE SCREEN, URINE: NOT DETECTED
OPIATE, UR SCREEN: NOT DETECTED
Phencyclidine (PCP) Ur S: NOT DETECTED
Tricyclic, Ur Screen: NOT DETECTED

## 2015-06-09 SURGERY — Surgical Case
Anesthesia: Spinal

## 2015-06-09 MED ORDER — NALBUPHINE HCL 10 MG/ML IJ SOLN
5.0000 mg | Freq: Once | INTRAMUSCULAR | Status: AC | PRN
Start: 2015-06-09 — End: 2015-06-09

## 2015-06-09 MED ORDER — SIMETHICONE 80 MG PO CHEW
80.0000 mg | CHEWABLE_TABLET | Freq: Three times a day (TID) | ORAL | Status: DC
  Administered 2015-06-10 – 2015-06-11 (×5): 80 mg via ORAL
  Filled 2015-06-09 (×5): qty 1

## 2015-06-09 MED ORDER — ONDANSETRON HCL 4 MG/2ML IJ SOLN
INTRAMUSCULAR | Status: DC | PRN
  Administered 2015-06-09: 4 mg via INTRAVENOUS

## 2015-06-09 MED ORDER — OXYTOCIN 40 UNITS IN LACTATED RINGERS INFUSION - SIMPLE MED
62.5000 mL/h | INTRAVENOUS | Status: DC
  Filled 2015-06-09: qty 1000

## 2015-06-09 MED ORDER — LACTATED RINGERS IV SOLN
INTRAVENOUS | Status: DC
  Administered 2015-06-09 – 2015-06-10 (×3): via INTRAVENOUS

## 2015-06-09 MED ORDER — DIPHENHYDRAMINE HCL 50 MG/ML IJ SOLN
12.5000 mg | INTRAMUSCULAR | Status: DC | PRN
  Administered 2015-06-09: 12.5 mg via INTRAVENOUS
  Filled 2015-06-09: qty 1

## 2015-06-09 MED ORDER — BUPIVACAINE HCL (PF) 0.75 % IJ SOLN
INTRAMUSCULAR | Status: DC | PRN
  Administered 2015-06-09: 12.5 mg

## 2015-06-09 MED ORDER — FENTANYL CITRATE (PF) 100 MCG/2ML IJ SOLN: 25.0000 ug | INTRAMUSCULAR | Status: DC | PRN

## 2015-06-09 MED ORDER — EPHEDRINE SULFATE 50 MG/ML IJ SOLN
INTRAMUSCULAR | Status: DC | PRN
  Administered 2015-06-09: 10 mg via INTRAVENOUS

## 2015-06-09 MED ORDER — CEFAZOLIN SODIUM-DEXTROSE 2-3 GM-% IV SOLR
2.0000 g | INTRAVENOUS | Status: AC
  Administered 2015-06-09: 2 g via INTRAVENOUS
  Filled 2015-06-09: qty 50

## 2015-06-09 MED ORDER — LACTATED RINGERS IV SOLN
Freq: Once | INTRAVENOUS | Status: AC
  Administered 2015-06-09: 1000 mL via INTRAVENOUS

## 2015-06-09 MED ORDER — ONDANSETRON HCL 4 MG/2ML IJ SOLN: 4.0000 mg | Freq: Three times a day (TID) | INTRAMUSCULAR | Status: DC | PRN

## 2015-06-09 MED ORDER — BUPIVACAINE 0.25 % ON-Q PUMP DUAL CATH 400 ML
400.0000 mL | INJECTION | Status: DC
  Filled 2015-06-09: qty 400

## 2015-06-09 MED ORDER — NALBUPHINE HCL 10 MG/ML IJ SOLN
5.0000 mg | Freq: Once | INTRAMUSCULAR | Status: AC | PRN
  Administered 2015-06-09 (×2): 5 mg via INTRAVENOUS

## 2015-06-09 MED ORDER — SCOPOLAMINE 1 MG/3DAYS TD PT72: 1.0000 | MEDICATED_PATCH | Freq: Once | TRANSDERMAL | Status: DC

## 2015-06-09 MED ORDER — BUPIVACAINE HCL (PF) 0.5 % IJ SOLN
5.0000 mL | Freq: Once | INTRAMUSCULAR | Status: DC
  Filled 2015-06-09: qty 30

## 2015-06-09 MED ORDER — DIPHENHYDRAMINE HCL 25 MG PO CAPS
25.0000 mg | ORAL_CAPSULE | ORAL | Status: DC | PRN
  Administered 2015-06-10 (×3): 25 mg via ORAL
  Filled 2015-06-09 (×2): qty 1

## 2015-06-09 MED ORDER — NALBUPHINE HCL 10 MG/ML IJ SOLN
5.0000 mg | INTRAMUSCULAR | Status: DC | PRN
  Administered 2015-06-09 (×2): 5 mg via INTRAVENOUS
  Filled 2015-06-09: qty 1
  Filled 2015-06-09 (×2): qty 0.5

## 2015-06-09 MED ORDER — WITCH HAZEL-GLYCERIN EX PADS: 1.0000 "application " | MEDICATED_PAD | CUTANEOUS | Status: DC | PRN

## 2015-06-09 MED ORDER — SODIUM CHLORIDE 0.9 % IJ SOLN: 3.0000 mL | INTRAMUSCULAR | Status: DC | PRN

## 2015-06-09 MED ORDER — NALBUPHINE HCL 10 MG/ML IJ SOLN
5.0000 mg | INTRAMUSCULAR | Status: DC | PRN
  Filled 2015-06-09: qty 0.5

## 2015-06-09 MED ORDER — OXYTOCIN 10 UNIT/ML IJ SOLN
INTRAMUSCULAR | Status: DC | PRN
  Administered 2015-06-09: 10 [IU] via INTRAMUSCULAR

## 2015-06-09 MED ORDER — ONDANSETRON HCL 4 MG/2ML IJ SOLN: 4.0000 mg | Freq: Once | INTRAMUSCULAR | Status: DC | PRN

## 2015-06-09 MED ORDER — NALBUPHINE HCL 10 MG/ML IJ SOLN
INTRAMUSCULAR | Status: AC
  Filled 2015-06-09: qty 1

## 2015-06-09 MED ORDER — CITRIC ACID-SODIUM CITRATE 334-500 MG/5ML PO SOLN
30.0000 mL | ORAL | Status: AC
  Administered 2015-06-09: 30 mL via ORAL
  Filled 2015-06-09: qty 15

## 2015-06-09 MED ORDER — MENTHOL 3 MG MT LOZG: 1.0000 | LOZENGE | OROMUCOSAL | Status: DC | PRN

## 2015-06-09 MED ORDER — KETOROLAC TROMETHAMINE 30 MG/ML IJ SOLN
30.0000 mg | Freq: Four times a day (QID) | INTRAMUSCULAR | Status: AC | PRN
  Administered 2015-06-09 – 2015-06-10 (×4): 30 mg via INTRAVENOUS
  Filled 2015-06-09 (×4): qty 1

## 2015-06-09 MED ORDER — NALOXONE HCL 0.4 MG/ML IJ SOLN: 0.4000 mg | INTRAMUSCULAR | Status: DC | PRN

## 2015-06-09 MED ORDER — MORPHINE SULFATE (PF) 0.5 MG/ML IJ SOLN
INTRAMUSCULAR | Status: DC | PRN
  Administered 2015-06-09: .2 mg via EPIDURAL

## 2015-06-09 MED ORDER — KETOROLAC TROMETHAMINE 30 MG/ML IJ SOLN: 30.0000 mg | Freq: Four times a day (QID) | INTRAMUSCULAR | Status: AC | PRN

## 2015-06-09 MED ORDER — FERROUS SULFATE 325 (65 FE) MG PO TABS
325.0000 mg | ORAL_TABLET | Freq: Two times a day (BID) | ORAL | Status: DC
  Administered 2015-06-10 – 2015-06-11 (×3): 325 mg via ORAL
  Filled 2015-06-09 (×3): qty 1

## 2015-06-09 MED ORDER — BUPIVACAINE HCL (PF) 0.5 % IJ SOLN: 5.0000 mL | Freq: Once | INTRAMUSCULAR | Status: DC

## 2015-06-09 MED ORDER — LACTATED RINGERS IV SOLN
INTRAVENOUS | Status: DC | PRN
  Administered 2015-06-09 (×2): via INTRAVENOUS

## 2015-06-09 MED ORDER — SENNOSIDES-DOCUSATE SODIUM 8.6-50 MG PO TABS
2.0000 | ORAL_TABLET | ORAL | Status: DC
  Administered 2015-06-10: 2 via ORAL
  Filled 2015-06-09: qty 2

## 2015-06-09 MED ORDER — LANOLIN HYDROUS EX OINT: 1.0000 "application " | TOPICAL_OINTMENT | CUTANEOUS | Status: DC | PRN

## 2015-06-09 MED ORDER — MEPERIDINE HCL 25 MG/ML IJ SOLN: 6.2500 mg | INTRAMUSCULAR | Status: DC | PRN

## 2015-06-09 MED ORDER — BUPIVACAINE ON-Q PAIN PUMP (FOR ORDER SET NO CHG)
INJECTION | Status: DC
  Filled 2015-06-09: qty 1

## 2015-06-09 MED ORDER — PHENYLEPHRINE HCL 10 MG/ML IJ SOLN
INTRAMUSCULAR | Status: DC | PRN
  Administered 2015-06-09: 100 ug via INTRAVENOUS

## 2015-06-09 MED ORDER — NALOXONE HCL 2 MG/2ML IJ SOSY
1.0000 ug/kg/h | PREFILLED_SYRINGE | INTRAVENOUS | Status: DC | PRN
  Filled 2015-06-09: qty 2

## 2015-06-09 MED ORDER — DIBUCAINE 1 % RE OINT: 1.0000 "application " | TOPICAL_OINTMENT | RECTAL | Status: DC | PRN

## 2015-06-09 MED ORDER — DIPHENHYDRAMINE HCL 25 MG PO CAPS
25.0000 mg | ORAL_CAPSULE | Freq: Four times a day (QID) | ORAL | Status: DC | PRN
  Filled 2015-06-09: qty 1

## 2015-06-09 MED ORDER — PRENATAL MULTIVITAMIN CH
1.0000 | ORAL_TABLET | Freq: Every day | ORAL | Status: DC
  Administered 2015-06-10 – 2015-06-11 (×2): 1 via ORAL
  Filled 2015-06-09 (×2): qty 1

## 2015-06-09 MED ORDER — BUPIVACAINE HCL 0.5 % IJ SOLN
INTRAMUSCULAR | Status: DC | PRN
Start: 2015-06-09 — End: 2015-06-09
  Administered 2015-06-09: 10 mL

## 2015-06-09 MED ORDER — NALBUPHINE HCL 10 MG/ML IJ SOLN
INTRAMUSCULAR | Status: AC
  Administered 2015-06-09: 5 mg via INTRAVENOUS
  Filled 2015-06-09: qty 1

## 2015-06-09 MED ORDER — CITRIC ACID-SODIUM CITRATE 334-500 MG/5ML PO SOLN
30.0000 mL | ORAL | Status: AC
  Administered 2015-06-09: 30 mL via ORAL
  Filled 2015-06-09: qty 30

## 2015-06-09 MED ORDER — LACTATED RINGERS IV SOLN
INTRAVENOUS | Status: DC
  Administered 2015-06-09: 07:00:00 via INTRAVENOUS

## 2015-06-09 SURGICAL SUPPLY — 27 items
CANISTER SUCT 3000ML (MISCELLANEOUS) ×3 IMPLANT
CATH KIT ON-Q SILVERSOAK 5IN (CATHETERS) ×6 IMPLANT
CLOSURE WOUND 1/2 X4 (GAUZE/BANDAGES/DRESSINGS) ×1
DRSG TELFA 3X8 NADH (GAUZE/BANDAGES/DRESSINGS) ×3 IMPLANT
ELECT CAUTERY BLADE 6.4 (BLADE) ×3 IMPLANT
GAUZE SPONGE 4X4 12PLY STRL (GAUZE/BANDAGES/DRESSINGS) ×3 IMPLANT
GLOVE BIO SURGEON STRL SZ7 (GLOVE) ×3 IMPLANT
GLOVE INDICATOR 7.5 STRL GRN (GLOVE) ×3 IMPLANT
GOWN STRL REUS W/ TWL LRG LVL3 (GOWN DISPOSABLE) ×3 IMPLANT
GOWN STRL REUS W/TWL LRG LVL3 (GOWN DISPOSABLE) ×6
LIQUID BAND (GAUZE/BANDAGES/DRESSINGS) ×3 IMPLANT
NS IRRIG 1000ML POUR BTL (IV SOLUTION) ×3 IMPLANT
PACK C SECTION AR (MISCELLANEOUS) ×3 IMPLANT
PAD GROUND ADULT SPLIT (MISCELLANEOUS) ×3 IMPLANT
PAD OB MATERNITY 4.3X12.25 (PERSONAL CARE ITEMS) ×6 IMPLANT
PAD PREP 24X41 OB/GYN DISP (PERSONAL CARE ITEMS) ×3 IMPLANT
SPONGE LAP 18X18 5 PK (GAUZE/BANDAGES/DRESSINGS) ×6 IMPLANT
STRIP CLOSURE SKIN 1/2X4 (GAUZE/BANDAGES/DRESSINGS) ×2 IMPLANT
SUT CHROMIC GUT BROWN 0 54 (SUTURE) ×1 IMPLANT
SUT CHROMIC GUT BROWN 0 54IN (SUTURE) ×3
SUT MNCRL 4-0 (SUTURE) ×2
SUT MNCRL 4-0 27XMFL (SUTURE) ×1
SUT PDS AB 1 TP1 96 (SUTURE) ×3 IMPLANT
SUT PLAIN 2 0 XLH (SUTURE) IMPLANT
SUT VIC AB 0 CT1 36 (SUTURE) ×12 IMPLANT
SUTURE MNCRL 4-0 27XMF (SUTURE) ×1 IMPLANT
SWABSTK COMLB BENZOIN TINCTURE (MISCELLANEOUS) ×3 IMPLANT
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
## 2015-06-09 NOTE — H&P (Signed)
History and Physical Interval Note:  Barbara Boone  has presented today for surgery, with the diagnosis of PRIOR C SECTION  The various methods of treatment have been discussed with the patient and family. After consideration of risks, benefits and other options for treatment, the patient has consented to  Procedure(s): CESAREAN SECTION (N/A) as a surgical intervention .  The patient's history has been reviewed, patient examined, no change in status, stable for surgery.  I have reviewed the patient's chart and labs.  Questions were answered to the patient's satisfaction.    The patient does not take a beta blocker and one is not indicated for this surgery.  Conard NovakJackson, Annebelle Bostic D, MD 06/09/2015 7:41 AM

## 2015-06-09 NOTE — Transfer of Care (Signed)
Immediate Anesthesia Transfer of Care Note  Patient: Barbara Boone  Procedure(s) Performed: Procedure(s): CESAREAN SECTION (N/A)  Patient Location: PACU  Anesthesia Type:MAC  Level of Consciousness: awake  Airway & Oxygen Therapy: Patient Spontanous Breathing  Post-op Assessment: Report given to RN  Post vital signs: stable  Last Vitals:  Filed Vitals:   06/09/15 0450 06/09/15 0600  BP: 111/77 134/86  Pulse: 91 84  Temp: 36.7 C   Resp: 16 16    Complications: No apparent anesthesia complications

## 2015-06-09 NOTE — Anesthesia Preprocedure Evaluation (Signed)
Anesthesia Evaluation  Patient identified by MRN, date of birth, ID band Patient awake    Reviewed: Allergy & Precautions, NPO status , Patient's Chart, lab work & pertinent test results, reviewed documented beta blocker date and time   Airway Mallampati: II  TM Distance: >3 FB     Dental  (+) Chipped   Pulmonary asthma , Current Smoker,           Cardiovascular      Neuro/Psych    GI/Hepatic   Endo/Other    Renal/GU      Musculoskeletal   Abdominal   Peds  Hematology  (+) anemia ,   Anesthesia Other Findings   Reproductive/Obstetrics                             Anesthesia Physical Anesthesia Plan  ASA: II  Anesthesia Plan: Spinal   Post-op Pain Management:    Induction:   Airway Management Planned:   Additional Equipment:   Intra-op Plan:   Post-operative Plan:   Informed Consent: I have reviewed the patients History and Physical, chart, labs and discussed the procedure including the risks, benefits and alternatives for the proposed anesthesia with the patient or authorized representative who has indicated his/her understanding and acceptance.     Plan Discussed with: CRNA  Anesthesia Plan Comments:         Anesthesia Quick Evaluation

## 2015-06-09 NOTE — Op Note (Signed)
Barbara Boone   06/09/2015   Pre-operative Diagnosis:  1) Intrauterine pregnancy at 4050w3d  2) history of Barbara delivery, desires repeat  Post-operative Diagnosis:  1) Intrauterine pregnancy at 5150w3d  2) history of Barbara delivery, desires repeat  Procedure: Repeat low transverse Barbara delivery via pfannenstiel incision with double layer uterine closure  Surgeon: Surgeon(s) and Role:    * Conard NovakStephen D Jony Ladnier, MD - Primary    * Vena AustriaAndreas Staebler, MD - Assisting   Anesthesia: spinal   Findings:  1) normal appearing gravid uterus, fallopian tubes, and ovaries 2) viable female infant with weight 2,850grams, APGARs 8 at 1 minute and 9 at 5 minutes   Estimated Blood Loss: 750 mL  Total IV Fluids: 1,900 ml   Urine Output: 150 mL clear urine at end of procedure  Specimens: None  Complications: no complications  Disposition: PACU - hemodynamically stable.   Maternal Condition: stable   Baby condition / location:  Couplet care / Skin to Skin  Procedure Details:  The patient was seen in the Holding Room. The risks, benefits, complications, treatment options, and expected outcomes were discussed with the patient. The patient concurred with the proposed plan, giving informed consent. identified as Barbara Boone and the procedure verified as C-Section Delivery. A Time Out was held and the above information confirmed.   After induction of anesthesia, the patient was draped and prepped in the usual sterile manner. A Pfannenstiel incision was made and carried down through the subcutaneous tissue to the fascia. Fascial incision was made and extended transversely. The fascia was separated from the underlying rectus tissue superiorly and inferiorly. The peritoneum was identified and entered. Peritoneal incision was extended longitudinally. The bladder flap was not bluntly freed from the lower uterine segment. A low transverse uterine incision was  made and the hysterotomy was extended with cranial-caudal tension. Delivered from cephalic presentation was a 2,850 gram Living newborn infant(s) or Female with Apgar scores of 8 at one minute and 9 at five minutes. Cord ph was not sent the umbilical cord was clamped and cut cord blood was obtained for evaluation. The placenta was removed Intact and appeared normal. The uterine outline, tubes and ovaries appeared normal. The uterine incision was closed with running locked sutures of 0 Vicryl.  A second layer of the same suture was thrown in an imbricating fashion.  The left-most aspect of the incision was over-sewn with an additional layer of 0 vicryl in a running fashion due to continued bleeding.  Hemostasis was assured.  The uterus was returned to the abdomen and the paracolic gutters were cleared of all clots and debris.  The peritoneum was reapproximated using 0-Vicryl in a running fashion.  The rectus muscles were inspected and found to be hemostatic.  The On-Q catheter pumps were inserted in accordance with the manufacturer's recommendations.  The catheters were inserted approximately 4cm cephelad to the incision line, approximately 1cm apart, straddling the midline.  They were inserted to a depth of the 4th mark. They were positioned superficial to the rectus abdominus muscles and deep to the rectus fascia.    The fascia was then reapproximated with running sutures of 1-0 PDS, looped. The subcutaneous tissue was reapproximated using 2-0 plain gut such that no greater than 2cm of dead space remained. The skin closure was accomplished with staples.  The On-Q catheters were bolused with 5 mL of 0.5% marcaine plain for a total of 10 mL.  The catheters were  affixed to the skin with surgical skin glue, steri-strips, and tegaderm.    Instrument, sponge, and needle counts were correct prior the abdominal closure and were correct at the conclusion of the case.  The patient received Ancef 2 gram IV prior to  skin incision (within 30 minutes). For VTE prophylaxis she was wearing SCDs throughout the case.  Signed: Conard Novak, MD 06/09/2015 9:20 AM

## 2015-06-09 NOTE — Anesthesia Procedure Notes (Signed)
Spinal Patient location during procedure: OR Staffing Anesthesiologist: Berdine AddisonHOMAS, Barbara Boone Performed by: anesthesiologist  Preanesthetic Checklist Completed: patient identified, site marked, surgical consent, pre-op evaluation, timeout performed, IV checked and risks and benefits discussed Spinal Block Patient position: sitting Prep: Betadine Patient monitoring: heart rate, cardiac monitor, continuous pulse ox and blood pressure Approach: midline Location: L3-4 Injection technique: single-shot Needle Needle type: Pencil-Tip  Needle gauge: 25 G Needle length: 9 cm Assessment Sensory level: T8 Additional Notes 0757 Spinal narcs. 12.5mg  and 0.2mg  astromorph.

## 2015-06-10 LAB — CBC
HCT: 27.4 % — ABNORMAL LOW (ref 35.0–47.0)
HEMOGLOBIN: 8.8 g/dL — AB (ref 12.0–16.0)
MCH: 25.4 pg — ABNORMAL LOW (ref 26.0–34.0)
MCHC: 32.1 g/dL (ref 32.0–36.0)
MCV: 79.1 fL — ABNORMAL LOW (ref 80.0–100.0)
Platelets: 235 10*3/uL (ref 150–440)
RBC: 3.46 MIL/uL — ABNORMAL LOW (ref 3.80–5.20)
RDW: 14.9 % — AB (ref 11.5–14.5)
WBC: 7.7 10*3/uL (ref 3.6–11.0)

## 2015-06-10 MED ORDER — OXYCODONE-ACETAMINOPHEN 5-325 MG PO TABS
2.0000 | ORAL_TABLET | ORAL | Status: DC | PRN
  Administered 2015-06-10 – 2015-06-11 (×8): 2 via ORAL
  Filled 2015-06-10 (×8): qty 2

## 2015-06-10 MED ORDER — OXYCODONE-ACETAMINOPHEN 5-325 MG PO TABS: 1.0000 | ORAL_TABLET | ORAL | Status: DC | PRN

## 2015-06-10 NOTE — Clinical Social Work Note (Signed)
Due to concerns regarding patient's inappropriate comments along with the situational factors CSW has made a DSS CPS report to the oncall DSS [place] rep: Baird Lyonsasey today.  York SpanielMonica Amani Nodarse MSW,LCSW [phone]

## 2015-06-10 NOTE — Anesthesia Postprocedure Evaluation (Signed)
Anesthesia Post Note  Patient: Barbara Boone  Procedure(s) Performed: Procedure(s) (LRB): CESAREAN SECTION (N/A)  Patient location during evaluation: [facility] Anesthesia Type: Spinal Level of consciousness: awake and alert and oriented Pain management: satisfactory to patient Vital Signs Assessment: post-procedure vital signs reviewed and stable Respiratory status: spontaneous breathing Cardiovascular status: blood pressure returned to baseline Anesthetic complications: no    Last Vitals:  Filed Vitals:   06/09/15 2338 06/10/15 0417  BP: 124/65 115/61  Pulse: 83 79  Temp: 37.2 C 36.9 C  Resp: 20 18    Last Pain:  Filed Vitals:   06/10/15 0545  PainSc: 7                  Clydene PughBeane, Devika Dragovich D

## 2015-06-10 NOTE — Progress Notes (Signed)
Marcial Pless Neese, RN was having a conversation with the patient about her adoption process with her son, the patient stated " I'm not happy with it though, because they aren't doing what they are suppose to do, they aren't communicating with her any more and they live in [place]", she states "I'm gonna stab them". And she stated "I'm going to get them back down to [place] because [place] doesn't like gay people". I asked what that had to do with the process and she stated " two gay men adopted him and [place] doesn't like gay people." 

## 2015-06-10 NOTE — Care Management (Signed)
Received referral for patient has a positive drug screen this admission ---------. Referral to Clinical Social Worker. Gwenette GreetBrenda S Lucienne Sawyers RN MSN CCM Care Management [phone]

## 2015-06-10 NOTE — Progress Notes (Signed)
Daily Post Partum Note  Barbara Boone is a 24 y.o. [id number]  POD#1 s/p repeat c-section @ 1316w3d.  Pregnancy c/b polysubstance abuse, STIs, scant PNC  24hr/overnight events:  None  Subjective:  Minimal lochia, pain controlled; no ambulation or void yet (foley out earlier in the morning)  Objective:    Current Vital Signs 24h Vital Sign Ranges  T 98 F (36.7 C) Temp  Avg: 98.4 F (36.9 C)  Min: 97.8 F (36.6 C)  Max: 98.9 F (37.2 C)  BP 129/66 mmHg BP  Min: 115/61  Max: 129/66  HR 75 Pulse  Avg: 83.7  Min: 75  Max: 90  RR 18 Resp  Avg: 19  Min: 18  Max: 20  SaO2 100 % Not Delivered SpO2  Avg: 99.3 %  Min: 98 %  Max: 100 %       24 Hour I/O Current Shift I/O  Time Ins Outs 12/22 0701 - 12/23 0700 In: 1739.6 [I.V.:1689.6] Out: 4025 [Urine:3275] 12/23 0701 - 12/23 1900 In: 2910 [P.O.:240; I.V.:2670] Out: 900 [Urine:900]    General: NAD Abdomen: soft, nttp, nd, c/d/i dressing with on q pump in place Perineum: deferred Skin:  Warm and dry.  Cardiovascular: Regular rate and rhythm. Respiratory:  Clear to auscultation bilateral. Normal respiratory effort Extremities: no c/c/e  Medications Current Facility-Administered Medications  Medication Dose Route Frequency Provider Last Rate Last Dose  . bupivacaine ON-Q pain pump   Other Continuous Conard NovakStephen D Jackson, MD      . witch hazel-glycerin (TUCKS) pad 1 application  1 application Topical PRN Conard NovakStephen D Jackson, MD       And  . dibucaine (NUPERCAINAL) 1 % rectal ointment 1 application  1 application Rectal PRN Conard NovakStephen D Jackson, MD      . diphenhydrAMINE (BENADRYL) injection 12.5 mg  12.5 mg Intravenous Q4H PRN Berdine AddisonMathai Thomas, MD   12.5 mg at 06/09/15 1606   Or  . diphenhydrAMINE (BENADRYL) capsule 25 mg  25 mg Oral Q4H PRN Berdine AddisonMathai Thomas, MD   25 mg at 06/10/15 [id number]  . diphenhydrAMINE (BENADRYL) capsule 25 mg  25 mg Oral Q6H PRN Conard NovakStephen D Jackson, MD      . ferrous sulfate tablet 325 mg  325 mg Oral BID WC Conard NovakStephen D Jackson,  MD   325 mg at 06/10/15 0935  . lactated ringers infusion   Intravenous Continuous Conard NovakStephen D Jackson, MD 125 mL/hr at 06/10/15 0543    . lanolin ointment 1 application  1 application Topical PRN Conard NovakStephen D Jackson, MD      . menthol-cetylpyridinium (CEPACOL) lozenge 3 mg  1 lozenge Oral Q2H PRN Conard NovakStephen D Jackson, MD      . nalbuphine (NUBAIN) injection 5 mg  5 mg Intravenous Q4H PRN Berdine AddisonMathai Thomas, MD   5 mg at 06/09/15 2349   Or  . nalbuphine (NUBAIN) injection 5 mg  5 mg Subcutaneous Q4H PRN Berdine AddisonMathai Thomas, MD      . naloxone [facility](NARCAN) 2 mg in dextrose 5 % 250 mL infusion  1-4 mcg/kg/hr Intravenous Continuous PRN Berdine AddisonMathai Thomas, MD      . naloxone [facility](NARCAN) injection 0.4 mg  0.4 mg Intravenous PRN Berdine AddisonMathai Thomas, MD       And  . sodium chloride 0.9 % injection 3 mL  3 mL Intravenous PRN Berdine AddisonMathai Thomas, MD      . ondansetron (ZOFRAN) injection 4 mg  4 mg Intravenous Q8H PRN Berdine AddisonMathai Thomas, MD      . oxyCODONE-acetaminophen (PERCOCET/ROXICET) [phone]  MG per tablet 1 tablet  1 tablet Oral Q4H PRN Conard Novak, MD       Or  . oxyCODONE-acetaminophen (PERCOCET/ROXICET) 5-325 MG per tablet 2 tablet  2 tablet Oral Q4H PRN Conard Novak, MD   2 tablet at 06/10/15 [phone]  . prenatal multivitamin tablet 1 tablet  1 tablet Oral Q1200 Conard Novak, MD   1 tablet at 06/10/15 0935  . scopolamine (TRANSDERM-SCOP) 1 MG/3DAYS 1.5 mg  1 patch Transdermal Once Berdine Addison, MD   1.5 mg at 06/09/15 1748  . senna-docusate (Senokot-S) tablet 2 tablet  2 tablet Oral Q24H Conard Novak, MD      . simethicone [facility]) chewable tablet 80 mg  80 mg Oral TID PC Conard Novak, MD   80 mg at 06/10/15 9604    Labs:   Recent Labs Lab 06/08/15 1051 06/10/15 0626  WBC 7.3 7.7  HGB 10.4* 8.8*  HCT 32.7* 27.4*  PLT 278 235   No results for input(s): NA, K, CL, CO2, BUN, CREATININE, LABGLOM, GLUCOSE, CALCIUM in the last 168 hours.  Assessment & Plan:  Pt stable *Postpartum/postop: routine care. BC not  d/w pt today *Psych: patient at high risk for PP depression. I offered psych consult for her but she declines. Given her scant PNC, I wouldn't offer her medication, given that she may not follow up in the office *Social: see by SW. Negative UDS and GC/CT on admission *Dispo: likely POD#3-4  Cornelia Copa. MD [facility] Pager [phone]

## 2015-06-10 NOTE — Progress Notes (Signed)
Late note, per conversation with patient 06/09/15 after delivery. During care of patient, I heard patient talking to a friend on her phone about "going out and eating and having a good time for her birthday", I asked her when her birthday is, she said [birth year], I wished her a happy birthday and asked if she had plans. She responded "yeah, gonna bring the baby here for y'all to babysit and I'm going to get drunk" I responded that since she just had surgery and a new baby, that wasn't a good plan and she needed to wait until she was medically cleared before drinking alcohol. The patient laughed it off and continued her conversations with visitors in the room.

## 2015-06-10 NOTE — Anesthesia Post-op Follow-up Note (Signed)
  Anesthesia Pain Follow-up Note  Patient: Barbara Boone  Day #: 1  Date of Follow-up: 06/10/2015 Time: 7:32 AM  Last Vitals:  Filed Vitals:   06/09/15 2338 06/10/15 0417  BP: 124/65 115/61  Pulse: 83 79  Temp: 37.2 C 36.9 C  Resp: 20 18    Level of Consciousness: alert  Pain: mild   Side Effects:None  Catheter Site Exam:site not evaluted  Plan: D/C from anesthesia care  Clydene PughBeane, Ladashia Demarinis D

## 2015-06-10 NOTE — Clinical Social Work Maternal (Signed)
CLINICAL SOCIAL WORK MATERNAL/CHILD NOTE  Patient Details  Name: Barbara Boone MRN: [id number] Date of Birth: 1991/04/02  Date:  06/10/2015  Clinical Social Worker Initiating Note:  Shela Leff MSW,LCSW Date/ Time Initiated:  06/10/15/      Child's Name:      Legal Guardian:  Mother   Need for Interpreter:  None   Date of Referral:  06/10/15     Reason for Referral:  Grief and Loss , Other (Comment) (Positive UDS during pregnancy; inappropriate comments made to staff)   Referral Source:  RN   Address:     Phone number:      Household Members:   (states she lives with her mother)   Therapist, music (not living in the home):  Extended Family   Professional Supports: Case Metallurgist   Employment: Full-time   Type of Work:     Education:      Pensions consultant:  [employer]   Other Resources:      Cultural/Religious Considerations Which May Impact Care:  none Strengths:  Ability to meet basic needs , Compliance with medical plan , Home prepared for child    Risk Factors/Current Problems:  Mental Health Concerns    Cognitive State:  Alert , Able to Concentrate , Insightful , Poor Judgement    Mood/Affect:  Flat , Sad    CSW Assessment: CSW consulted by nursing staff for history of drug abuse during pregnancy, depression regarding giving one child up for adoption 17 months ago, and inappropriate comments. Patient's nurse reported that patient made a comment that she was going to "get drunk" and "bring baby to hospital so you can watch it for me." Patient's nurse also reported that patient made a comment that she was going to "stab the adoptive parents of her 73 month old son."  CSW met with patient for a while in her hospital room this morning. Patient was feeding and tending to her newborn appropriately and has been according to nursing staff. Patient's affect was flat and sad the entire assessment. Patient reports that she lives with her  mother currently and that she also has her 13 year old child in the home with her (who is currently being cared for by it's fob while she is in the hospital due to the delivery of her newborn). Patient reports she has all supplies and necessities for her newborn in the home. She stated she is mainly concerned about daycare for her newborn at this time. She states she is looking into this.   In reference to the 2 inappropriate comments she made, she stated "I was joking and didn't mean it." She initially denied the comment about her getting drunk and dropping baby off but then she stated she did say it but did not mean it. CSW explained to the patient the concerns regarding her comments and how they could be interpreted and the seriousness of her statements. Patient verbalized understanding. In regards to her drug use, she reports she cannot remember how frequent and last time of use for her cocaine but then reports it might be 1 time a per month. Patient stated she used marijuana in the beginning of her pregnancy. Patient and newborn both had negative UDS's on this admission.   Throughout assessment it was difficult for CSW to get patient to answer questions with a direct answer but if CSW kept pressing, she would give more detailed answers. CSW discussed with patient her giving her son up for adoption. Patient  admits she is not dealing with it well and regrets her decision. Patient stated she has not received counseling for it and states she went to [facility] during her pregnancy but she stated "they could not give me any medication because I was pregnant". She did not follow up again with them for counseling. Patient reports she has a Case Manager through [facility]. Patient verbalizes she used cocaine and marijuana because she was having a difficult time dealing with giving her son up for adoption and feeling guilty for keeping this newborn and her other child. Patient reports she  doesn't believe talking about it with a counselor or taking any medication for depression will change the decision she made or give her baby back to her. CSW attempted to explain that it might assist with her coping mechanisms though. Patient verbalized understanding.   CSW reported off to nurse and MD and MD is going to consult psychiatry to assist with possible medication referral and additional recommendations.   CSW Plan/Description:  Psychosocial Support and Ongoing Assessment of Needs    Shela Leff, LCSW 06/10/2015, 9:42 AM

## 2015-06-11 MED ORDER — IBUPROFEN 600 MG PO TABS: 600.0000 mg | ORAL_TABLET | Freq: Four times a day (QID) | ORAL | Status: DC | PRN

## 2015-06-11 MED ORDER — OXYCODONE-ACETAMINOPHEN 5-325 MG PO TABS: 1.0000 | ORAL_TABLET | ORAL | Status: DC | PRN

## 2015-06-11 MED ORDER — FERROUS SULFATE 325 (65 FE) MG PO TABS: 325.0000 mg | ORAL_TABLET | Freq: Two times a day (BID) | ORAL | Status: DC

## 2015-06-11 NOTE — Clinical Social Work Note (Signed)
CSW discovered this morning that the MD did not order the psych consult due to patient refusal. DSS CPS report made yesterday.  York SpanielMonica Nitish Roes MSW,LCSW [phone]

## 2015-06-11 NOTE — Progress Notes (Signed)
Education provided on need for Influenza and TDaP vaccines. Pt declines vaccines at this time. Reynold BowenSusan Paisley Zosia Lucchese, RN 06/11/2015 1:57 PM

## 2015-06-11 NOTE — Discharge Instructions (Signed)
Call your doctor for increased pain or vaginal bleeding, temperature above 100.4, depression, or concerns.  No strenuous activity or heavy lifting for 6 weeks.  No intercourse, tampons, douching, or enemas for 6 weeks.  No tub baths-showers only.  No driving for 2 weeks or while taking pain medications.  Continue prenatal vitamin and iron.  Keep incision clean and dry.  Call your doctor for incision concerns including redness, swelling, bleeding or drainage, or if begins to come apart.  Increase calories and fluids while breastfeeding. °

## 2015-06-11 NOTE — Progress Notes (Signed)
Discharge instructions provided.  Pt and sig other verbalize understanding of all instructions and follow-up care.  Incision hygiene kit and prescriptions given.  Pt discharged to home with infant at 1515 on 06/11/15 via wheelchair by RN. Reed Breech, RN 06/11/2015 7:56 PM

## 2016-03-05 ENCOUNTER — Other Ambulatory Visit: Payer: Self-pay | Admitting: Obstetrics & Gynecology

## 2016-03-05 DIAGNOSIS — O3680X0 Pregnancy with inconclusive fetal viability, not applicable or unspecified: Secondary | ICD-10-CM

## 2016-03-06 ENCOUNTER — Other Ambulatory Visit: Payer: Self-pay

## 2016-03-08 ENCOUNTER — Other Ambulatory Visit: Payer: Medicaid Other

## 2016-03-08 ENCOUNTER — Other Ambulatory Visit: Payer: Self-pay | Admitting: Obstetrics & Gynecology

## 2016-03-08 ENCOUNTER — Ambulatory Visit (INDEPENDENT_AMBULATORY_CARE_PROVIDER_SITE_OTHER): Payer: Medicaid Other

## 2016-03-08 DIAGNOSIS — Z3A14 14 weeks gestation of pregnancy: Secondary | ICD-10-CM | POA: Diagnosis not present

## 2016-03-08 DIAGNOSIS — O3680X0 Pregnancy with inconclusive fetal viability, not applicable or unspecified: Secondary | ICD-10-CM

## 2016-03-08 DIAGNOSIS — Z369 Encounter for antenatal screening, unspecified: Secondary | ICD-10-CM

## 2016-03-08 NOTE — Progress Notes (Signed)
US 13+5 wks,single IUP,normal ov's bilat,post pl, fhr 157 bpm,NB present,NT 1.7 mm,crl 78.1 mm

## 2016-03-10 LAB — MATERNAL SCREEN, INTEGRATED #1
CROWN RUMP LENGTH MAT SCREEN: 78.1 mm
Gest. Age on Collection Date: 13.3 weeks
Maternal Age at EDD: 23.2 years
Nuchal Translucency (NT): 1.7 mm
Number of Fetuses: 1
PAPP-A VALUE: 1406.7 ng/mL
Weight: 197 [lb_av]

## 2016-03-14 ENCOUNTER — Telehealth: Payer: Self-pay | Admitting: *Deleted

## 2016-03-14 NOTE — Telephone Encounter (Signed)
WIC office called to report hemoglobin of 10.6, pt states she was not given a PNV but is taking a children's chewable vitamin with iron.  Pt states she is scheduled here for new OB appointment next week.

## 2016-03-23 ENCOUNTER — Other Ambulatory Visit (HOSPITAL_COMMUNITY)
Admission: RE | Admit: 2016-03-23 | Discharge: 2016-03-23 | Disposition: A | Discharge status: Home / Self Care | Payer: Medicaid Other | Source: Ambulatory Visit | Attending: Adult Health | Admitting: Adult Health

## 2016-03-23 ENCOUNTER — Ambulatory Visit (INDEPENDENT_AMBULATORY_CARE_PROVIDER_SITE_OTHER): Payer: Medicaid Other | Admitting: Adult Health

## 2016-03-23 ENCOUNTER — Encounter: Payer: Self-pay | Admitting: Adult Health

## 2016-03-23 VITALS — BP 110/70 | HR 84 | Wt 204.0 lb

## 2016-03-23 DIAGNOSIS — Z3402 Encounter for supervision of normal first pregnancy, second trimester: Secondary | ICD-10-CM

## 2016-03-23 DIAGNOSIS — Z98891 History of uterine scar from previous surgery: Secondary | ICD-10-CM

## 2016-03-23 DIAGNOSIS — O34219 Maternal care for unspecified type scar from previous cesarean delivery: Secondary | ICD-10-CM | POA: Diagnosis not present

## 2016-03-23 DIAGNOSIS — Z1389 Encounter for screening for other disorder: Secondary | ICD-10-CM | POA: Diagnosis not present

## 2016-03-23 DIAGNOSIS — Z01419 Encounter for gynecological examination (general) (routine) without abnormal findings: Secondary | ICD-10-CM | POA: Diagnosis present

## 2016-03-23 DIAGNOSIS — Z3481 Encounter for supervision of other normal pregnancy, first trimester: Secondary | ICD-10-CM

## 2016-03-23 DIAGNOSIS — Z3A16 16 weeks gestation of pregnancy: Secondary | ICD-10-CM | POA: Diagnosis not present

## 2016-03-23 DIAGNOSIS — Z113 Encounter for screening for infections with a predominantly sexual mode of transmission: Secondary | ICD-10-CM | POA: Diagnosis present

## 2016-03-23 DIAGNOSIS — Z331 Pregnant state, incidental: Secondary | ICD-10-CM

## 2016-03-23 DIAGNOSIS — Z0283 Encounter for blood-alcohol and blood-drug test: Secondary | ICD-10-CM

## 2016-03-23 DIAGNOSIS — Z3490 Encounter for supervision of normal pregnancy, unspecified, unspecified trimester: Secondary | ICD-10-CM

## 2016-03-23 HISTORY — DX: Encounter for supervision of normal pregnancy, unspecified, unspecified trimester: Z34.90

## 2016-03-23 LAB — POCT URINALYSIS DIPSTICK
GLUCOSE UA: NEGATIVE
Ketones, UA: NEGATIVE
Leukocytes, UA: NEGATIVE
NITRITE UA: NEGATIVE
Protein, UA: NEGATIVE
RBC UA: NEGATIVE

## 2016-03-23 MED ORDER — PRENATAL PLUS 27-1 MG PO TABS: 1.0000 | ORAL_TABLET | Freq: Every day | ORAL | 11 refills | Status: AC

## 2016-03-23 NOTE — Progress Notes (Signed)
Subjective:  Barbara Boone is a 25 y.o. G63P3003 African American female at [redacted]w[redacted]d by [place] being seen today for her first obstetrical visit.  Her obstetrical history is significant for 3 prior C sections, Had PIH with first pregnancy..  Pregnancy history fully reviewed.  Patient reports no complaints. Denies vb, cramping, uti s/s, abnormal/malodorous vag d/c, or vulvovaginal itching/irritation.  BP 110/70   Pulse 84   Wt 204 lb (92.5 kg)   LMP  (LMP Unknown)   BMI 30.13 kg/m   HISTORY: OB History  Gravida Para Term Preterm AB Living  4 3 3     3   SAB TAB Ectopic Multiple Live Births        0 3    # Outcome Date GA Lbr Len/2nd Weight Sex Delivery Anes PTL Lv  4 Current           3 Term 06/09/15 [redacted]w[redacted]d  6 lb 4.5 oz (2.85 kg) F CS-LTranv Spinal  LIV  2 Term 11/03/13     CS-LTranv     1 Term 03/17/11     CS-LTranv        Past Medical History:  Diagnosis Date  . Anemia    iron deficiency  . Asthma    childhood asthma  . Supervision of normal pregnancy 03/23/2016    [facility] Initiated Care at   FOB  Dating By  Pap  GC/CT Initial:                36+wks: Genetic Screen NT/IT:  CF screen  Anatomic [place]  Flu vaccine  Tdap Recommended ~ 28wks Glucose Screen  2 hr GBS  Feed Preference  Contraception  Circumcision  Childbirth Classes  Pediatrician     Past Surgical History:  Procedure Laterality Date  . CESAREAN SECTION    . CESAREAN SECTION N/A 06/09/2015   Procedure: CESAREAN SECTION;  Surgeon: Conard Novak, MD;  Location: [facility] ORS;  Service: Obstetrics;  Laterality: N/A;   Family History  Problem Relation Age of Onset  . Sickle cell anemia Sister   . Sickle cell trait Daughter   . Other Maternal Grandfather     has a pacemaker    Exam   System:     General: Well developed & nourished, no acute distress   Skin: Warm & dry, normal coloration and turgor, no rashes   Neurologic: Alert & oriented, normal mood   Cardiovascular: Regular rate & rhythm   Respiratory:  Effort & rate normal, LCTAB, acyanotic   Abdomen: Soft, non tender   Extremities: normal strength, tone   Pelvic Exam:    Perineum: Normal perineum   Vulva: Normal, no lesions   Vagina:  Normal mucosa, normal discharge   Cervix: Normal, bulbous, appears closed   Uterus: Normal size/shape/contour for GA   Thin prep pap smear with GC/CHl performed FHR: 152  via doppler   Assessment:   Pregnancy: Z6X0960 Patient Active Problem List   Diagnosis Date Noted  . Supervision of normal pregnancy 03/23/2016  . Postpartum care following cesarean delivery 06/11/2015  . Supervision of high risk pregnancy in third trimester 06/09/2015  . History of cesarean section complicating pregnancy 06/09/2015  . History of maternal gonorrhea, currently pregnant in third trimester 06/09/2015  . Substance abuse affecting pregnancy in third trimester, antepartum 06/09/2015  . S/P cesarean section 06/09/2015  . Irregular contractions 05/18/2015    [redacted]w[redacted]d G4P3003 New OB visit     Plan:  Initial labs drawn Continue prenatal  vitamins Problem list reviewed and updated Reviewed n/v relief measures and warning s/s to report Reviewed recommended weight gain based on pre-gravid BMI Encouraged well-balanced diet Genetic Screening discussed Integrated Screen: requested Cystic fibrosis screening discussed requested Ultrasound discussed; fetal survey: requested Follow up in 2 weeks for for second IT test, and see Kim Rx prenatal plus, #30 take 1 daily with 11 refills Declines flu shot.  Adline PotterJennifer A. Audreana Hancox, NP 03/23/2016 10:00 AM

## 2016-03-23 NOTE — Progress Notes (Signed)
PHQ 2 score 0

## 2016-03-23 NOTE — Patient Instructions (Signed)

## 2016-03-25 LAB — URINE CULTURE

## 2016-03-26 ENCOUNTER — Telehealth: Payer: Self-pay | Admitting: Adult Health

## 2016-03-26 LAB — CYTOLOGY - PAP

## 2016-03-26 NOTE — Telephone Encounter (Signed)
Spoke with pt letting her know to add 325 mg OTC iron to prenatal vit. Pt voiced understanding. JSY

## 2016-03-26 NOTE — Telephone Encounter (Signed)
Will add iron, routed to janet.

## 2016-03-30 LAB — URINALYSIS, ROUTINE W REFLEX MICROSCOPIC
BILIRUBIN UA: NEGATIVE
GLUCOSE, UA: NEGATIVE
KETONES UA: NEGATIVE
LEUKOCYTES UA: NEGATIVE
Nitrite, UA: NEGATIVE
PROTEIN UA: NEGATIVE
RBC UA: NEGATIVE
UUROB: 0.2 mg/dL (ref 0.2–1.0)
pH, UA: 6.5 (ref 5.0–7.5)

## 2016-03-30 LAB — CBC
HEMOGLOBIN: 10.8 g/dL — AB (ref 11.1–15.9)
Hematocrit: 35 % (ref 34.0–46.6)
MCH: 24.8 pg — ABNORMAL LOW (ref 26.6–33.0)
MCHC: 30.9 g/dL — AB (ref 31.5–35.7)
MCV: 80 fL (ref 79–97)
Platelets: 334 10*3/uL (ref 150–379)
RBC: 4.36 x10E6/uL (ref 3.77–5.28)
RDW: 14.9 % (ref 12.3–15.4)
WBC: 6.4 10*3/uL (ref 3.4–10.8)

## 2016-03-30 LAB — RUBELLA SCREEN: Rubella Antibodies, IGG: 5.31 index (ref >0.99)

## 2016-03-30 LAB — PMP SCREEN PROFILE (10S), URINE
AMPHETAMINE SCRN UR: NEGATIVE ng/mL
Barbiturate Screen, Ur: NEGATIVE ng/mL
Benzodiazepine Screen, Urine: NEGATIVE ng/mL
CANNABINOIDS UR QL SCN: POSITIVE ng/mL
COCAINE(METAB.) SCREEN, URINE: NEGATIVE ng/mL
Creatinine(Crt), U: 20.3 mg/dL (ref 20.0–300.0)
Methadone Scn, Ur: NEGATIVE ng/mL
OPIATE SCRN UR: NEGATIVE ng/mL
Oxycodone+Oxymorphone Ur Ql Scn: NEGATIVE ng/mL
PCP SCRN UR: NEGATIVE ng/mL
Ph of Urine: 5.8 (ref 4.5–8.9)
Propoxyphene, Screen: NEGATIVE ng/mL

## 2016-03-30 LAB — ABO/RH: Rh Factor: POSITIVE

## 2016-03-30 LAB — RPR: RPR: NONREACTIVE

## 2016-03-30 LAB — SICKLE CELL SCREEN: SICKLE CELL SCREEN: NEGATIVE

## 2016-03-30 LAB — CYSTIC FIBROSIS MUTATION 97: GENE DIS ANAL CARRIER INTERP BLD/T-IMP: NOT DETECTED

## 2016-03-30 LAB — HEPATITIS B SURFACE ANTIGEN: HEP B S AG: NEGATIVE

## 2016-03-30 LAB — HIV ANTIBODY (ROUTINE TESTING W REFLEX): HIV Screen 4th Generation wRfx: NONREACTIVE

## 2016-03-30 LAB — ANTIBODY SCREEN: Antibody Screen: NEGATIVE

## 2016-03-30 LAB — VARICELLA ZOSTER ANTIBODY, IGG: VARICELLA: 557 {index} (ref >165)

## 2016-04-06 ENCOUNTER — Encounter: Payer: Medicaid Other | Admitting: Women's Health

## 2016-04-11 ENCOUNTER — Ambulatory Visit (INDEPENDENT_AMBULATORY_CARE_PROVIDER_SITE_OTHER): Payer: Medicaid Other | Admitting: Advanced Practice Midwife

## 2016-04-11 ENCOUNTER — Encounter: Payer: Self-pay | Admitting: Advanced Practice Midwife

## 2016-04-11 VITALS — BP 102/60 | HR 76 | Wt 214.0 lb

## 2016-04-11 DIAGNOSIS — Z3402 Encounter for supervision of normal first pregnancy, second trimester: Secondary | ICD-10-CM

## 2016-04-11 DIAGNOSIS — Z23 Encounter for immunization: Secondary | ICD-10-CM

## 2016-04-11 DIAGNOSIS — Z331 Pregnant state, incidental: Secondary | ICD-10-CM

## 2016-04-11 DIAGNOSIS — F1911 Other psychoactive substance abuse, in remission: Secondary | ICD-10-CM | POA: Insufficient documentation

## 2016-04-11 DIAGNOSIS — Z3A19 19 weeks gestation of pregnancy: Secondary | ICD-10-CM

## 2016-04-11 DIAGNOSIS — O26892 Other specified pregnancy related conditions, second trimester: Secondary | ICD-10-CM

## 2016-04-11 DIAGNOSIS — Z1389 Encounter for screening for other disorder: Secondary | ICD-10-CM

## 2016-04-11 DIAGNOSIS — Z3682 Encounter for antenatal screening for nuchal translucency: Secondary | ICD-10-CM

## 2016-04-11 DIAGNOSIS — Z363 Encounter for antenatal screening for malformations: Secondary | ICD-10-CM

## 2016-04-11 DIAGNOSIS — Z3482 Encounter for supervision of other normal pregnancy, second trimester: Secondary | ICD-10-CM

## 2016-04-11 DIAGNOSIS — R1084 Generalized abdominal pain: Secondary | ICD-10-CM | POA: Diagnosis not present

## 2016-04-11 LAB — POCT URINALYSIS DIPSTICK
Blood, UA: NEGATIVE
Glucose, UA: NEGATIVE
Ketones, UA: NEGATIVE
LEUKOCYTES UA: NEGATIVE
NITRITE UA: NEGATIVE
Protein, UA: NEGATIVE

## 2016-04-11 MED ORDER — METRONIDAZOLE 500 MG PO TABS: 500.0000 mg | ORAL_TABLET | Freq: Two times a day (BID) | ORAL | 0 refills | Status: DC

## 2016-04-11 NOTE — Progress Notes (Signed)
Z6X0960G4P3003 4276w4d Estimated Date of Delivery: 09/08/16  Blood pressure 102/60, pulse 76, weight 214 lb (97.1 kg), unknown if currently breastfeeding.   BP weight and urine results all reviewed and noted.  Please refer to the obstetrical flow sheet for the fundal height and fetal heart rate documentation:  Patient reports good fetal movement, denies any bleeding and no rupture of membranes symptoms or regular contractions. Patient c/o itch,runny dc since October 1`.  GC/CHL neg on pap.  SSE: thin runny yellow DC w/amine odor  Wet prep + clue only All questions were answered.  Orders Placed This Encounter  Procedures  . Flu Vaccine QUAD 36+ mos IM  . Maternal Screen, Integrated #2  . POCT urinalysis dipstick    Plan:  Continued routine obstetrical care, 2nd IT  Return in about 2 weeks (around 04/25/2016) for LROB, AV:[id number]:Anatomy.

## 2016-04-11 NOTE — Patient Instructions (Signed)

## 2016-04-12 IMAGING — US US EXTREM LOW VENOUS*L*
1 series · 13 of 24 positions shown · non-contrast
Comparison: None.

CLINICAL DATA: Leg swelling.  Bruising.  Pain.



[Series 1: us extrem low venous*left* · 0.08mm/px · 13 of 45 slices shown]
[im 1/45]
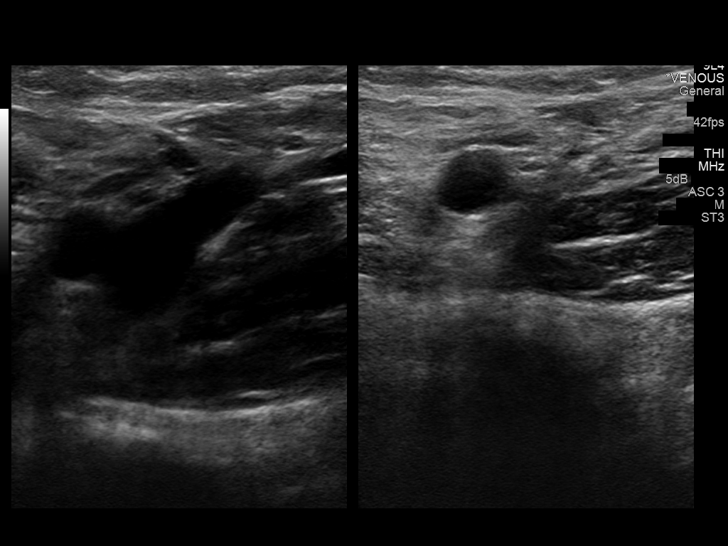
[im 4/45]
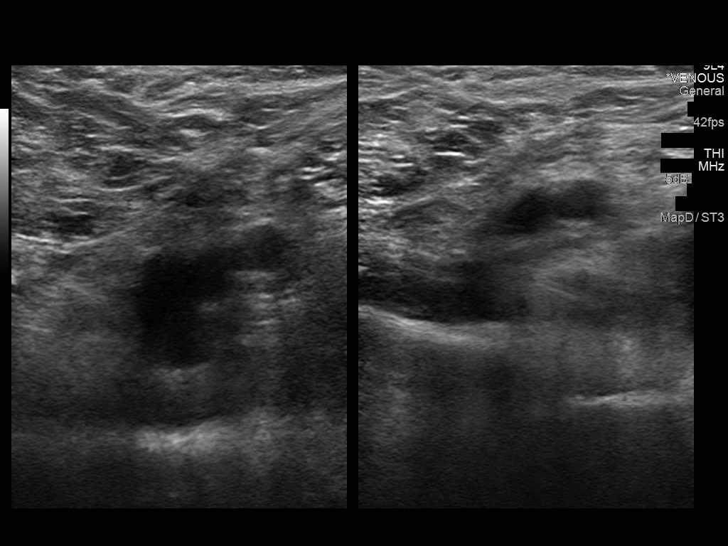
[im 8/45]
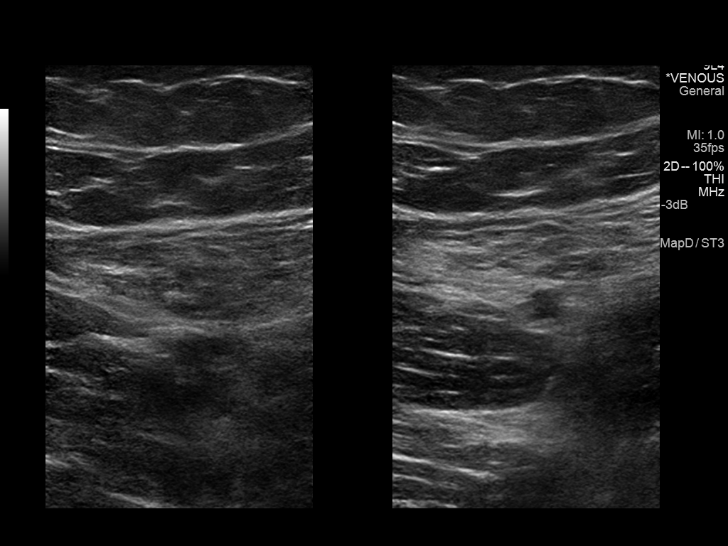
[im 12/45]
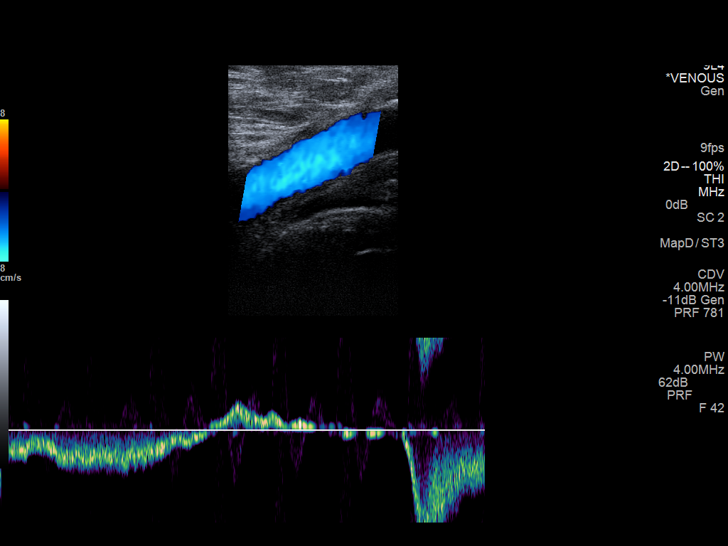
[im 16/45]
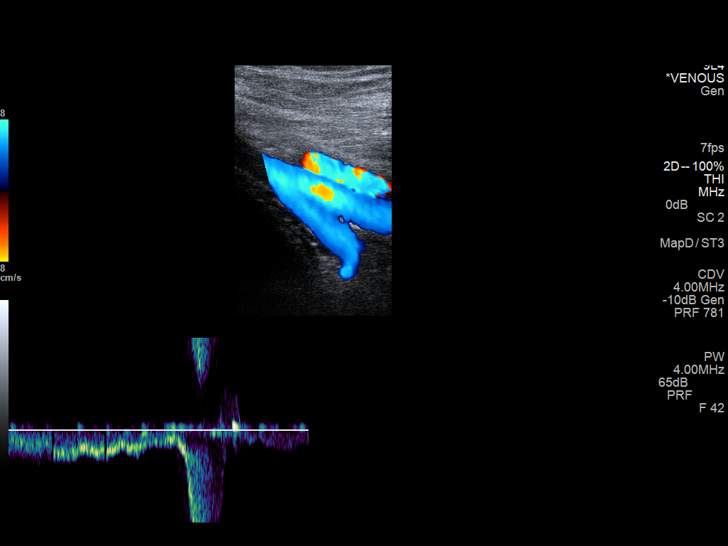
[im 20/45]
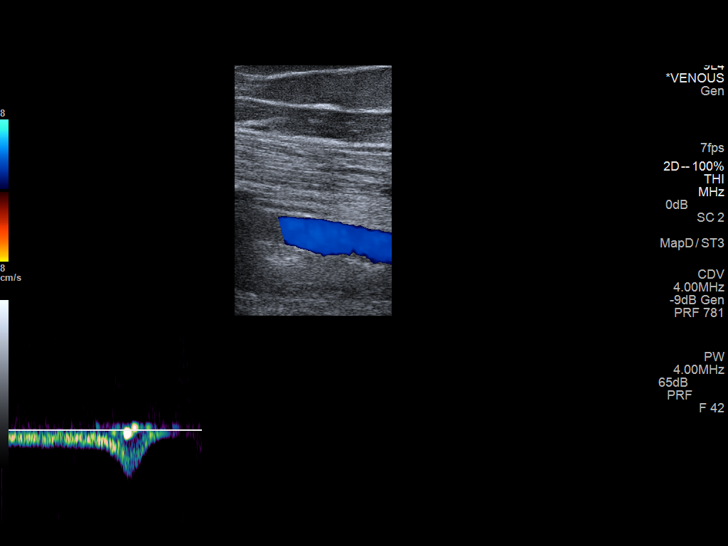
[im 23/45]
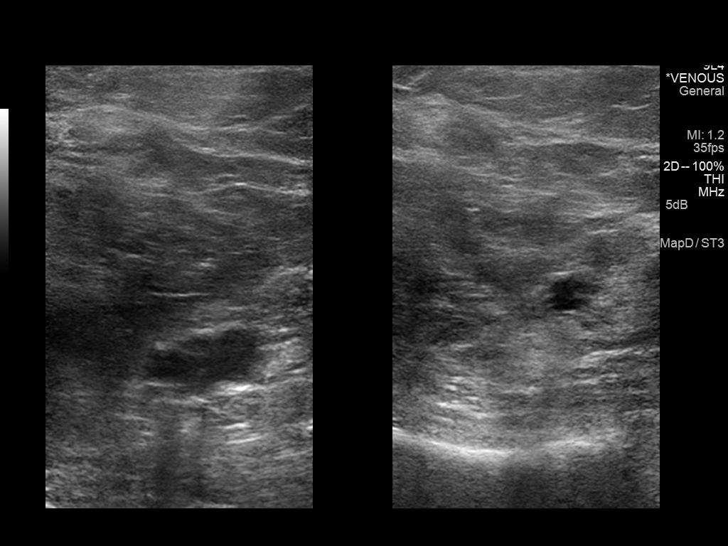
[im 25/45]
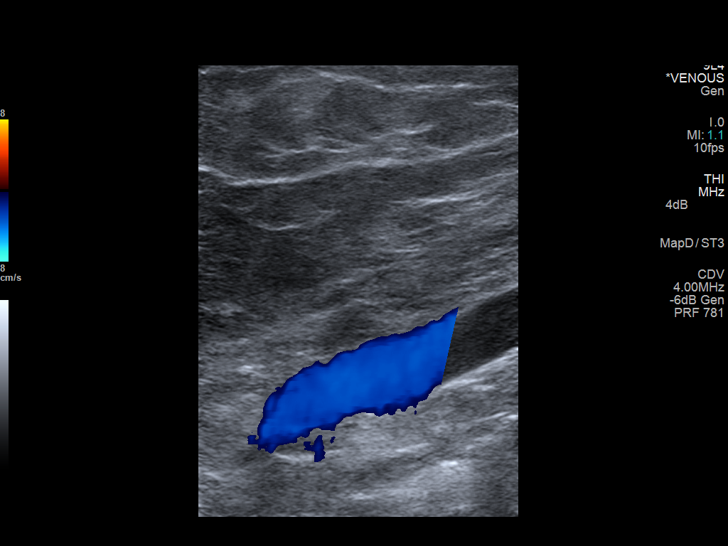
[im 29/45]
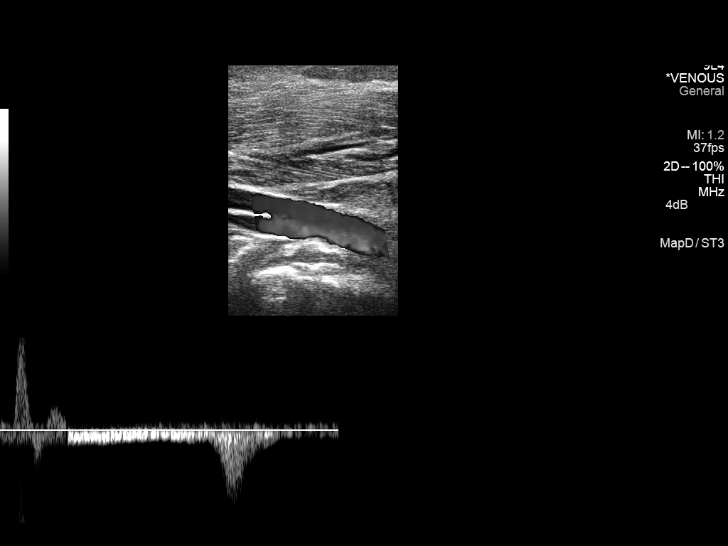
[im 33/45]
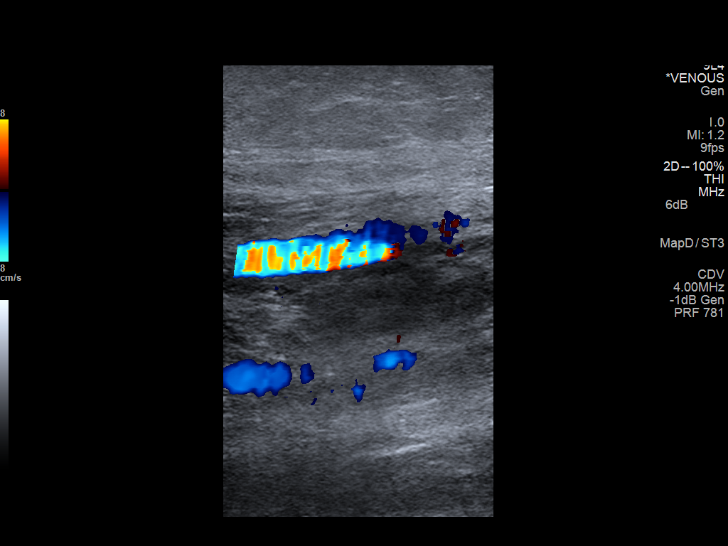
[im 37/45]
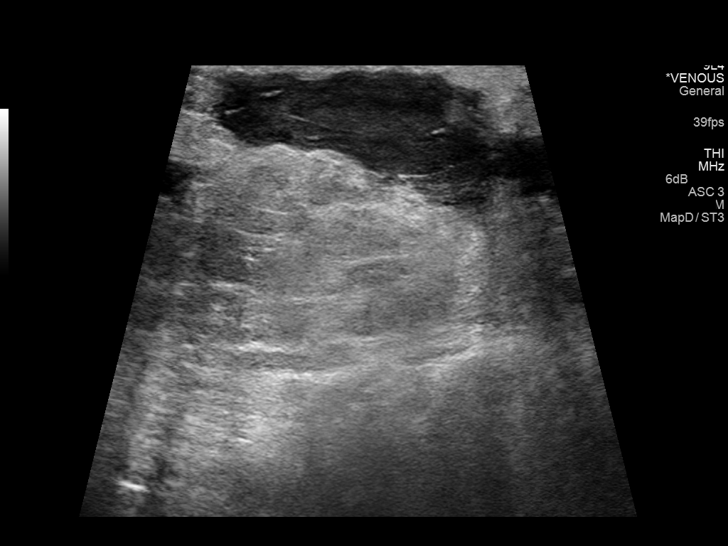
[im 41/45]
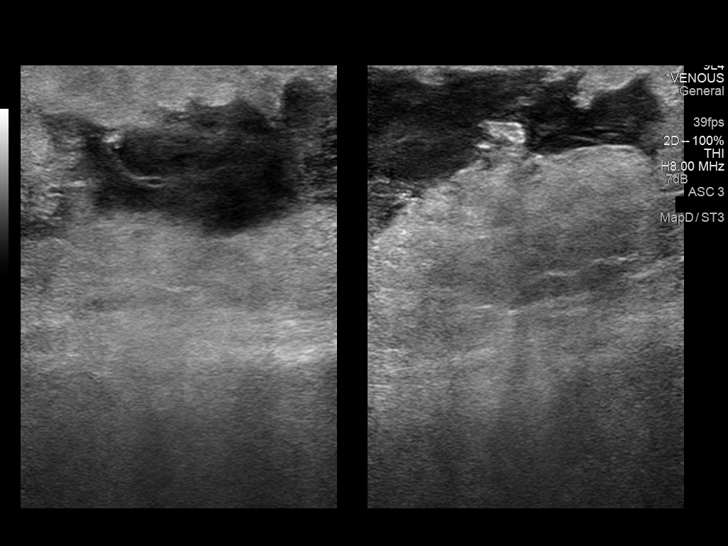
[im 45/45]
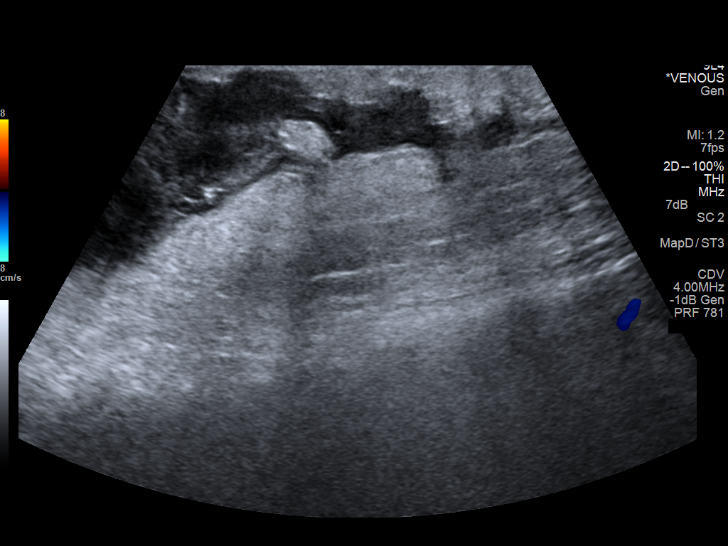

[13 of 24 positions shown; findings below may reference images not displayed]

FINDINGS: Contralateral Common Femoral Vein: Respiratory phasicity is normal
and symmetric with the symptomatic side. No evidence of thrombus.
Normal compressibility.

Common Femoral Vein: No evidence of thrombus. Normal
compressibility, respiratory phasicity and response to augmentation.

Saphenofemoral Junction: No evidence of thrombus. Normal
compressibility and flow on color Doppler imaging.

Profunda Femoral Vein: No evidence of thrombus. Normal
compressibility and flow on color Doppler imaging.

Femoral Vein: No evidence of thrombus. Normal compressibility,
respiratory phasicity and response to augmentation.

Popliteal Vein: No evidence of thrombus. Normal compressibility,
respiratory phasicity and response to augmentation.

Calf Veins: No evidence of thrombus. Normal compressibility and flow
on color Doppler imaging.

Superficial Great Saphenous Vein: No evidence of thrombus. Normal
compressibility and flow on color Doppler imaging.

Venous Reflux:  None.

Other Findings: There is an irregular heterogeneously hypoechoic
fluid collection in the left medial calf measuring 4.8 x 1.2 x
cm without internal Doppler flow most concerning for a hematoma
versus abscess.
IMPRESSION: 1. No evidence of deep venous thrombosis of the left lower
extremity.
2. There is an irregular heterogeneously hypoechoic fluid collection
in the left medial calf measuring 4.8 x 1.2 x 7.4 cm without
internal Doppler flow most concerning for a hematoma versus abscess.
Correlate with clinical history and exam.

## 2016-04-13 LAB — MATERNAL SCREEN, INTEGRATED #2
AFP MARKER: 37.3 ng/mL
AFP MOM: 1.01
CROWN RUMP LENGTH: 78.1 mm
DIA MoM: 1.16
DIA Value: 174.8 pg/mL
Estriol, Unconjugated: 1.23 ng/mL
GEST. AGE ON COLLECTION DATE: 13.3 wk
GESTATIONAL AGE: 18.1 wk
HCG VALUE: 6.9 [IU]/mL
MATERNAL AGE AT EDD: 23.2 a
Nuchal Translucency (NT): 1.7 mm
Nuchal Translucency MoM: 0.89
Number of Fetuses: 1
PAPP-A MOM: 1.55
PAPP-A VALUE: 1406.7 ng/mL
Test Results:: NEGATIVE
WEIGHT: 197 [lb_av]
WEIGHT: 197 [lb_av]
hCG MoM: 0.34
uE3 MoM: 1.03

## 2016-04-23 ENCOUNTER — Other Ambulatory Visit: Payer: Self-pay | Admitting: Advanced Practice Midwife

## 2016-04-23 DIAGNOSIS — Z363 Encounter for antenatal screening for malformations: Secondary | ICD-10-CM

## 2016-04-25 ENCOUNTER — Ambulatory Visit (INDEPENDENT_AMBULATORY_CARE_PROVIDER_SITE_OTHER): Payer: Medicaid Other | Admitting: Advanced Practice Midwife

## 2016-04-25 ENCOUNTER — Ambulatory Visit (INDEPENDENT_AMBULATORY_CARE_PROVIDER_SITE_OTHER): Payer: Medicaid Other

## 2016-04-25 ENCOUNTER — Encounter: Payer: Self-pay | Admitting: Advanced Practice Midwife

## 2016-04-25 VITALS — BP 130/84 | HR 68 | Wt 199.0 lb

## 2016-04-25 DIAGNOSIS — Z3A21 21 weeks gestation of pregnancy: Secondary | ICD-10-CM

## 2016-04-25 DIAGNOSIS — Z3482 Encounter for supervision of other normal pregnancy, second trimester: Secondary | ICD-10-CM | POA: Diagnosis not present

## 2016-04-25 DIAGNOSIS — Z331 Pregnant state, incidental: Secondary | ICD-10-CM

## 2016-04-25 DIAGNOSIS — Z1383 Encounter for screening for respiratory disorder NEC: Secondary | ICD-10-CM

## 2016-04-25 DIAGNOSIS — F1911 Other psychoactive substance abuse, in remission: Secondary | ICD-10-CM

## 2016-04-25 DIAGNOSIS — Z1389 Encounter for screening for other disorder: Secondary | ICD-10-CM

## 2016-04-25 DIAGNOSIS — O34219 Maternal care for unspecified type scar from previous cesarean delivery: Secondary | ICD-10-CM

## 2016-04-25 DIAGNOSIS — Z363 Encounter for antenatal screening for malformations: Secondary | ICD-10-CM | POA: Diagnosis not present

## 2016-04-25 DIAGNOSIS — Z3402 Encounter for supervision of normal first pregnancy, second trimester: Secondary | ICD-10-CM

## 2016-04-25 LAB — POCT URINALYSIS DIPSTICK
Blood, UA: NEGATIVE
GLUCOSE UA: NEGATIVE
Ketones, UA: NEGATIVE
LEUKOCYTES UA: NEGATIVE
NITRITE UA: NEGATIVE

## 2016-04-25 NOTE — Patient Instructions (Addendum)
Pick up over the counter iron supplement (325mg ) and take one a day, preferably on an empty stomach with some vitamin C (like juice)   Second Trimester of Pregnancy The second trimester is from week 13 through week 28, months 4 through 6. The second trimester is often a time when you feel your best. Your body has also adjusted to being pregnant, and you begin to feel better physically. Usually, morning sickness has lessened or quit completely, you may have more energy, and you may have an increase in appetite. The second trimester is also a time when the fetus is growing rapidly. At the end of the sixth month, the fetus is about 9 inches long and weighs about 1 pounds. You will likely begin to feel the baby move (quickening) between 18 and 20 weeks of the pregnancy. BODY CHANGES Your body goes through many changes during pregnancy. The changes vary from woman to woman.   Your weight will continue to increase. You will notice your lower abdomen bulging out.  You may begin to get stretch marks on your hips, abdomen, and breasts.  You may develop headaches that can be relieved by medicines approved by your health care provider.  You may urinate more often because the fetus is pressing on your bladder.  You may develop or continue to have heartburn as a result of your pregnancy.  You may develop constipation because certain hormones are causing the muscles that push waste through your intestines to slow down.  You may develop hemorrhoids or swollen, bulging veins (varicose veins).  You may have back pain because of the weight gain and pregnancy hormones relaxing your joints between the bones in your pelvis and as a result of a shift in weight and the muscles that support your balance.  Your breasts will continue to grow and be tender.  Your gums may bleed and may be sensitive to brushing and flossing.  Dark spots or blotches (chloasma, mask of pregnancy) may develop on your face. This will  likely fade after the baby is born.  A dark line from your belly button to the pubic area (linea nigra) may appear. This will likely fade after the baby is born.  You may have changes in your hair. These can include thickening of your hair, rapid growth, and changes in texture. Some women also have hair loss during or after pregnancy, or hair that feels dry or thin. Your hair will most likely return to normal after your baby is born. WHAT TO EXPECT AT YOUR PRENATAL VISITS During a routine prenatal visit:  You will be weighed to make sure you and the fetus are growing normally.  Your blood pressure will be taken.  Your abdomen will be measured to track your baby's growth.  The fetal heartbeat will be listened to.  Any test results from the previous visit will be discussed. Your health care provider may ask you:  How you are feeling.  If you are feeling the baby move.  If you have had any abnormal symptoms, such as leaking fluid, bleeding, severe headaches, or abdominal cramping.  If you are using any tobacco products, including cigarettes, chewing tobacco, and electronic cigarettes.  If you have any questions. Other tests that may be performed during your second trimester include:  Blood tests that check for:  Low iron levels (anemia).  Gestational diabetes (between 24 and 28 weeks).  Rh antibodies.  Urine tests to check for infections, diabetes, or protein in the urine.  An  ultrasound to confirm the proper growth and development of the baby.  An amniocentesis to check for possible genetic problems.  Fetal screens for spina bifida and Down syndrome.  HIV (human immunodeficiency virus) testing. Routine prenatal testing includes screening for HIV, unless you choose not to have this test. HOME CARE INSTRUCTIONS   Avoid all smoking, herbs, alcohol, and unprescribed drugs. These chemicals affect the formation and growth of the baby.  Do not use any tobacco products,  including cigarettes, chewing tobacco, and electronic cigarettes. If you need help quitting, ask your health care provider. You may receive counseling support and other resources to help you quit.  Follow your health care provider's instructions regarding medicine use. There are medicines that are either safe or unsafe to take during pregnancy.  Exercise only as directed by your health care provider. Experiencing uterine cramps is a good sign to stop exercising.  Continue to eat regular, healthy meals.  Wear a good support bra for breast tenderness.  Do not use hot tubs, steam rooms, or saunas.  Wear your seat belt at all times when driving.  Avoid raw meat, uncooked cheese, cat litter boxes, and soil used by cats. These carry germs that can cause birth defects in the baby.  Take your prenatal vitamins.  Take 1500-2000 mg of calcium daily starting at the 20th week of pregnancy until you deliver your baby.  Try taking a stool softener (if your health care provider approves) if you develop constipation. Eat more high-fiber foods, such as fresh vegetables or fruit and whole grains. Drink plenty of fluids to keep your urine clear or pale yellow.  Take warm sitz baths to soothe any pain or discomfort caused by hemorrhoids. Use hemorrhoid cream if your health care provider approves.  If you develop varicose veins, wear support hose. Elevate your feet for 15 minutes, 3-4 times a day. Limit salt in your diet.  Avoid heavy lifting, wear low heel shoes, and practice good posture.  Rest with your legs elevated if you have leg cramps or low back pain.  Visit your dentist if you have not gone yet during your pregnancy. Use a soft toothbrush to brush your teeth and be gentle when you floss.  A sexual relationship may be continued unless your health care provider directs you otherwise.  Continue to go to all your prenatal visits as directed by your health care provider. SEEK MEDICAL CARE IF:    You have dizziness.  You have mild pelvic cramps, pelvic pressure, or nagging pain in the abdominal area.  You have persistent nausea, vomiting, or diarrhea.  You have a bad smelling vaginal discharge.  You have pain with urination. SEEK IMMEDIATE MEDICAL CARE IF:   You have a fever.  You are leaking fluid from your vagina.  You have spotting or bleeding from your vagina.  You have severe abdominal cramping or pain.  You have rapid weight gain or loss.  You have shortness of breath with chest pain.  You notice sudden or extreme swelling of your face, hands, ankles, feet, or legs.  You have not felt your baby move in over an hour.  You have severe headaches that do not go away with medicine.  You have vision changes.   This information is not intended to replace advice given to you by your health care provider. Make sure you discuss any questions you have with your health care provider.   Document Released: 05/29/2001 Document Revised: 06/25/2014 Document Reviewed: 08/05/2012 Elsevier Interactive Patient  Education 2016 [employer].

## 2016-04-25 NOTE — Progress Notes (Signed)
[id number] Estimated Date of Delivery: 09/08/16  Blood pressure 130/84, pulse 68, weight 199 lb (90.3 kg), unknown if currently breastfeeding.   BP weight and urine results all reviewed and noted.  Please refer to the obstetrical flow sheet for the fundal height and fetal heart rate documentation:  US 20+4 wks,cephalic,cx 3.8 cm,post pl gr 0,normal ov's bilat,fhr 150 bpm,svp of fluid 4 cm,efw 324 g,anatomy complete,no obvious abnormalities seen  Patient reports good fetal movement, denies any bleeding and no rupture of membranes symptoms or regular contractions. Patient is without complaints. All questions were answered.  Orders Placed This Encounter  Procedures  . POCT urinalysis dipstick   Start OTC FESo4 Plan:  Continued routine obstetrical care,   Return in about 4 weeks (around 05/23/2016) for LROB.

## 2016-04-25 NOTE — Progress Notes (Signed)
US 20+4 wks,cephalic,cx 3.8 cm,post pl gr 0,normal ov's bilat,fhr 150 bpm,svp of fluid 4 cm,efw 324 g,anatomy complete,no obvious abnormalities seen

## 2016-05-23 ENCOUNTER — Encounter: Payer: Medicaid Other | Admitting: Obstetrics and Gynecology

## 2016-07-31 ENCOUNTER — Encounter: Payer: Medicaid Other | Admitting: Obstetrics & Gynecology

## 2016-08-01 ENCOUNTER — Telehealth: Payer: Self-pay | Admitting: *Deleted

## 2016-08-02 NOTE — Telephone Encounter (Signed)
LMOVM for patient to return call. Patient has not been seen in our office since 04/25/16 and has missed appointments as well.

## 2017-01-11 ENCOUNTER — Encounter (HOSPITAL_COMMUNITY): Payer: Self-pay

## 2022-11-18 ENCOUNTER — Other Ambulatory Visit: Payer: Self-pay

## 2022-11-18 ENCOUNTER — Emergency Department (HOSPITAL_COMMUNITY)
Admission: EM | Admit: 2022-11-18 | Discharge: 2022-11-18 | Disposition: A | Discharge status: Home / Self Care | Payer: BLUE CROSS/BLUE SHIELD | Attending: Emergency Medicine | Admitting: Emergency Medicine

## 2022-11-18 ENCOUNTER — Encounter (HOSPITAL_COMMUNITY): Payer: Self-pay

## 2022-11-18 DIAGNOSIS — X58XXXD Exposure to other specified factors, subsequent encounter: Secondary | ICD-10-CM | POA: Insufficient documentation

## 2022-11-18 DIAGNOSIS — S81001D Unspecified open wound, right knee, subsequent encounter: Secondary | ICD-10-CM | POA: Insufficient documentation

## 2022-11-18 DIAGNOSIS — L089 Local infection of the skin and subcutaneous tissue, unspecified: Secondary | ICD-10-CM | POA: Diagnosis not present

## 2022-11-18 DIAGNOSIS — R21 Rash and other nonspecific skin eruption: Secondary | ICD-10-CM | POA: Diagnosis present

## 2022-11-18 LAB — COMPREHENSIVE METABOLIC PANEL
ALT: 24 U/L (ref 0–44)
AST: 17 U/L (ref 15–41)
Albumin: 3.1 g/dL — ABNORMAL LOW (ref 3.5–5.0)
Alkaline Phosphatase: 75 U/L (ref 38–126)
Anion gap: 11 (ref 5–15)
BUN: 16 mg/dL (ref 6–20)
CO2: 25 mmol/L (ref 22–32)
Calcium: 8.8 mg/dL — ABNORMAL LOW (ref 8.9–10.3)
Chloride: 102 mmol/L (ref 98–111)
Creatinine, Ser: 1.12 mg/dL — ABNORMAL HIGH (ref 0.44–1.00)
GFR, Estimated: >60 mL/min (ref >60)
Glucose, Bld: 102 mg/dL — ABNORMAL HIGH (ref 70–99)
Potassium: 3.7 mmol/L (ref 3.5–5.1)
Sodium: 138 mmol/L (ref 135–145)
Total Bilirubin: 0.4 mg/dL (ref 0.3–1.2)
Total Protein: 6.9 g/dL (ref 6.5–8.1)

## 2022-11-18 LAB — URINALYSIS, ROUTINE W REFLEX MICROSCOPIC
Bilirubin Urine: NEGATIVE
Glucose, UA: NEGATIVE mg/dL
Hgb urine dipstick: NEGATIVE
Ketones, ur: NEGATIVE mg/dL
Nitrite: NEGATIVE
Protein, ur: NEGATIVE mg/dL
Specific Gravity, Urine: 1.02 (ref 1.005–1.030)
pH: 6 (ref 5.0–8.0)

## 2022-11-18 LAB — CBC WITH DIFFERENTIAL/PLATELET
Abs Immature Granulocytes: 0.02 10*3/uL (ref 0.00–0.07)
Basophils Absolute: 0 10*3/uL (ref 0.0–0.1)
Basophils Relative: 0 %
Eosinophils Absolute: 0.2 10*3/uL (ref 0.0–0.5)
Eosinophils Relative: 3 %
HCT: 30.4 % — ABNORMAL LOW (ref 36.0–46.0)
Hemoglobin: 9.3 g/dL — ABNORMAL LOW (ref 12.0–15.0)
Immature Granulocytes: 0 %
Lymphocytes Relative: 38 %
Lymphs Abs: 2.3 10*3/uL (ref 0.7–4.0)
MCH: 26.1 pg (ref 26.0–34.0)
MCHC: 30.6 g/dL (ref 30.0–36.0)
MCV: 85.4 fL (ref 80.0–100.0)
Monocytes Absolute: 0.5 10*3/uL (ref 0.1–1.0)
Monocytes Relative: 9 %
Neutro Abs: 3 10*3/uL (ref 1.7–7.7)
Neutrophils Relative %: 50 %
Platelets: 556 10*3/uL — ABNORMAL HIGH (ref 150–400)
RBC: 3.56 MIL/uL — ABNORMAL LOW (ref 3.87–5.11)
RDW: 19.4 % — ABNORMAL HIGH (ref 11.5–15.5)
WBC: 6 10*3/uL (ref 4.0–10.5)
nRBC: 0 % (ref 0.0–0.2)

## 2022-11-18 LAB — I-STAT BETA HCG BLOOD, ED (MC, WL, AP ONLY): I-stat hCG, quantitative: <5 m[IU]/mL (ref <5)

## 2022-11-18 LAB — LACTIC ACID, PLASMA: Lactic Acid, Venous: 1.2 mmol/L (ref 0.5–1.9)

## 2022-11-18 MED ORDER — DOXYCYCLINE HYCLATE 100 MG PO CAPS: 100.0000 mg | ORAL_CAPSULE | Freq: Two times a day (BID) | ORAL | 0 refills | Status: DC

## 2022-11-18 MED ORDER — DOXYCYCLINE HYCLATE 100 MG PO CAPS: 100.0000 mg | ORAL_CAPSULE | Freq: Two times a day (BID) | ORAL | 0 refills | Status: AC

## 2022-11-18 NOTE — Discharge Instructions (Signed)
Doxycycline is an antibiotic which is taken twice a day, this treats bacterial infections that can cause staph infections, it treats sinus infections and some pneumonia.  In this case I would like for you to take the antibiotic exactly as prescribed until it is completed.  Please be aware that occasionally people will get a rash if they are in the sunlight for extended periods of time while taking this medicine.  Thank you for allowing [place] to treat you in the emergency department today.  After reviewing your examination and potential testing that was done it appears that you are safe to go home.  I would like for you to follow-up with your doctor within the next several days, have them obtain your results and follow-up with them to review all of these tests.  If you should develop severe or worsening symptoms return to the emergency department immediately  Make sure that you follow-up with your surgeon at your appointment on June 12

## 2022-11-18 NOTE — ED Provider Notes (Signed)
[place] EMERGENCY DEPARTMENT AT [facility] Provider Note   CSN: [id number] Arrival date & time: 11/18/22  1706     History  Chief Complaint  Patient presents with   Post Rt Knee Surgery    Drainage around stitches    Barbara Boone is a 32 y.o. female.  HPI   This patient is a 32 year old female presenting with a complaint of right knee rash, she reports that she had a surgical procedure to fix what she describes as a tibial plateau fracture, this occurred approximately 1 month ago in [place], she has had a incision on the medial aspect of her knee, she has been doing rehabilitation, she has been doing very well but noticed that she started to pick away the scab that the tissue underneath that appeared to have a discharge from it, she has had no fevers or chills, minimal knee pain, she has not taking the Lovenox injections anymore as it was only for 2 weeks, she denies any swelling of the leg and has not had any fevers or chills  The patient reports having her surgery in [place] and has a follow-up with her surgeon on June 12 to have stitches out  Home Medications Prior to Admission medications   Medication Sig Start Date End Date Taking? Authorizing Provider  doxycycline (VIBRAMYCIN) 100 MG capsule Take 1 capsule (100 mg total) by mouth 2 (two) times daily. 11/18/22   Eber Hong, MD  prenatal vitamin w/FE, FA (PRENATAL 1 + 1) 27-1 MG TABS tablet Take 1 tablet by mouth daily at 12 noon. 03/23/16   Adline Potter, NP      Allergies    Amoxicillin    Review of Systems   Review of Systems  Constitutional:  Negative for fever.  Skin:  Positive for rash.    Physical Exam Updated Vital Signs BP 125/82 (BP Location: Right Arm)   Pulse (!) 109   Temp 98.7 F (37.1 C) (Oral)   Resp 18   SpO2 100%  Physical Exam Vitals and nursing note reviewed.  Constitutional:      Appearance: She is well-developed. She is not diaphoretic.  HENT:     Head:  Normocephalic and atraumatic.  Eyes:     General:        Right eye: No discharge.        Left eye: No discharge.     Conjunctiva/sclera: Conjunctivae normal.  Pulmonary:     Effort: Pulmonary effort is normal. No respiratory distress.  Skin:    General: Skin is warm and dry.     Findings: No erythema or rash.     Comments: The incision on the right medial knee appears to be essentially intact, she has removed some of the scabs and there is some underlying tissue which appears to be healing and granulated, there is some exposed black polyethylene type suture material at each end of the wound, she has no significant discharge there is no surrounding redness there is no tenderness and she is able to fully flex and extend her knee.  Neurological:     Mental Status: She is alert.     Coordination: Coordination normal.     ED Results / Procedures / Treatments   Labs (all labs ordered are listed, but only abnormal results are displayed) Labs Reviewed  LACTIC ACID, PLASMA  COMPREHENSIVE METABOLIC PANEL  CBC WITH DIFFERENTIAL/PLATELET  URINALYSIS, ROUTINE W REFLEX MICROSCOPIC  I-STAT BETA HCG BLOOD, ED ([facility], [facility], [facility] ONLY)  EKG None  Radiology No results found.  Procedures Procedures    Medications Ordered in ED Medications - No data to display  ED Course/ Medical Decision Making/ A&P                             Medical Decision Making Amount and/or Complexity of Data Reviewed Labs: ordered.  Risk Prescription drug management.   This patient has a postop incision that appears to essentially be intact, there are some areas where it is not dehisced but appears to have the scab removed and some underlying tissue which is healing.  There is no purulent drainage or surrounding redness and there is no tenderness around the wound.  She has no fever, her tachycardia as represented in the initial triage vital signs has resolved and she appears well.  Will place on doxycycline and  she can follow-up with her surgeon at her appointment on June 12, the patient is agreeable, she states she is not pregnant        Final Clinical Impression(s) / ED Diagnoses Final diagnoses:  Wound infection    Rx / DC Orders ED Discharge Orders          Ordered    doxycycline (VIBRAMYCIN) 100 MG capsule  2 times daily,   Status:  Discontinued        11/18/22 1753    doxycycline (VIBRAMYCIN) 100 MG capsule  2 times daily        11/18/22 1754              Eber Hong, MD 11/18/22 1757

## 2022-11-18 NOTE — ED Triage Notes (Signed)
Pt came in via POV d/t pain, drainage & colored discharge from her stitches from a Rt knee surgery she reports was on 10/23/22 d/t breaking her knee. States her stitches has not been removed yet like they should have. A/Ox4, rates her pain 6/10, can bear weight on that leg but it just feels very tight in her lower leg.

## 2022-12-04 ENCOUNTER — Emergency Department (HOSPITAL_COMMUNITY)
Admission: EM | Admit: 2022-12-04 | Discharge: 2022-12-04 | Disposition: A | Discharge status: Home / Self Care | Payer: BLUE CROSS/BLUE SHIELD | Attending: Emergency Medicine | Admitting: Emergency Medicine

## 2022-12-04 ENCOUNTER — Encounter (HOSPITAL_COMMUNITY): Payer: Self-pay

## 2022-12-04 ENCOUNTER — Emergency Department (HOSPITAL_COMMUNITY): Payer: BLUE CROSS/BLUE SHIELD

## 2022-12-04 ENCOUNTER — Other Ambulatory Visit: Payer: Self-pay

## 2022-12-04 DIAGNOSIS — Z4802 Encounter for removal of sutures: Secondary | ICD-10-CM | POA: Diagnosis present

## 2022-12-04 DIAGNOSIS — R110 Nausea: Secondary | ICD-10-CM | POA: Insufficient documentation

## 2022-12-04 DIAGNOSIS — R200 Anesthesia of skin: Secondary | ICD-10-CM | POA: Diagnosis not present

## 2022-12-04 DIAGNOSIS — Z9889 Other specified postprocedural states: Secondary | ICD-10-CM

## 2022-12-04 DIAGNOSIS — Z8781 Personal history of (healed) traumatic fracture: Secondary | ICD-10-CM | POA: Diagnosis not present

## 2022-12-04 NOTE — ED Triage Notes (Signed)
Patient is here for evaluation of getting sutures out of right knee. Pt reports metal plate and screws placed in the right kneecap 10/23/22. Unable to visualize sutures at this time.

## 2022-12-04 NOTE — Discharge Instructions (Signed)
Please follow-up with orthopedics for further evaluation and management.  There was no apparent suture for removal upon my examination today.  You need to speak with orthopedics for further recommendations on weightbearing status and further follow-up information.  Ideally I would recommend following up with the surgeon who performed the operation in [place] but since this does not sound possible at this time, please follow-up with the on-call orthopedic provider.

## 2022-12-04 NOTE — ED Provider Notes (Signed)
[place] EMERGENCY DEPARTMENT AT [facility] Provider Note   CSN: [id number] Arrival date & time: 12/04/22  1028     History  Chief Complaint  Patient presents with   Suture / Staple Removal    Barbara Boone is a 32 y.o. female.  Patient presents to the ED for a "suture removal". Patient states she fractured her fibula, tibia and patella which required surgery on 10/23/2022. Patient says she hasn't seen her surgeon since the surgery because it was in [place]. Patient reports she was supposed to get sutures removed a few weeks ago. Patient was at [facility] ED on 06/02 because of drainage around suture and received doxycyline. Patient says she cut the ends of her sutures because it was causing pain. Patient endorses nausea and numbness around her knee. Pt denies fever, chills, swelling, vomiting. Patient is currently using a walker and is unsure about post-op instructions.  HPI     Home Medications Prior to Admission medications   Medication Sig Start Date End Date Taking? Authorizing Provider  doxycycline (VIBRAMYCIN) 100 MG capsule Take 1 capsule (100 mg total) by mouth 2 (two) times daily. 11/18/22   Eber Hong, MD  prenatal vitamin w/FE, FA (PRENATAL 1 + 1) 27-1 MG TABS tablet Take 1 tablet by mouth daily at 12 noon. 03/23/16   Adline Potter, NP      Allergies    Amoxicillin    Review of Systems   Review of Systems  Physical Exam Updated Vital Signs BP (!) 137/93 (BP Location: Right Arm)   Pulse 96   Temp 99 F (37.2 C) (Oral)   Resp 18   Ht 5\' 9"  (1.753 m)   Wt 86.2 kg   LMP 11/07/2022   SpO2 100%   BMI 28.06 kg/m  Physical Exam Vitals and nursing note reviewed.  HENT:     Head: Normocephalic and atraumatic.  Eyes:     Pupils: Pupils are equal, round, and reactive to light.  Pulmonary:     Effort: Pulmonary effort is normal. No respiratory distress.  Musculoskeletal:        General: No swelling, tenderness, deformity or signs of  injury.     Cervical back: Normal range of motion.     Comments: Patient right knee able to move through passive ROM. Patient able to actively ROM right knee  Skin:    General: Skin is dry.     Findings: No erythema.     Comments: No significant warmth, erythema noted to right knee. Healing incision with some eschar noted to medial right knee. No visible suture. No drainage.   Neurological:     Mental Status: She is alert.  Psychiatric:        Speech: Speech normal.        Behavior: Behavior normal.     ED Results / Procedures / Treatments   Labs (all labs ordered are listed, but only abnormal results are displayed) Labs Reviewed - No data to display  EKG None  Radiology DG Knee Complete 4 Views Right  Result Date: 12/04/2022 CLINICAL DATA:  Postop evaluation. EXAM: RIGHT KNEE - COMPLETE 4+ VIEW; RIGHT TIBIA AND FIBULA - 2 VIEW COMPARISON:  None Available. FINDINGS: Status post ORIF for proximal tibial fracture with plate and multiple screws the hardware is intact. No perihardware loosening. No appreciable joint effusion. No evidence of arthropathy or other focal bone abnormality. Soft tissues are unremarkable. Distal tibia and fibula are unremarkable. IMPRESSION: Status post ORIF  for proximal tibial fracture with intact hardware. Electronically Signed   By: Larose Hires D.O.   On: 12/04/2022 12:07   DG Tibia/Fibula Right  Result Date: 12/04/2022 CLINICAL DATA:  Postop evaluation. EXAM: RIGHT KNEE - COMPLETE 4+ VIEW; RIGHT TIBIA AND FIBULA - 2 VIEW COMPARISON:  None Available. FINDINGS: Status post ORIF for proximal tibial fracture with plate and multiple screws the hardware is intact. No perihardware loosening. No appreciable joint effusion. No evidence of arthropathy or other focal bone abnormality. Soft tissues are unremarkable. Distal tibia and fibula are unremarkable. IMPRESSION: Status post ORIF for proximal tibial fracture with intact hardware. Electronically Signed   By: Larose Hires D.O.   On: 12/04/2022 12:07    Procedures Procedures    Medications Ordered in ED Medications - No data to display  ED Course/ Medical Decision Making/ A&P                             Medical Decision Making Amount and/or Complexity of Data Reviewed Radiology: ordered.   Patient presents to the emergency room with a chief complaint of needing surgical follow-up.  Patient had some sort of ORIF in [place] at the beginning of May.  The patient has been unable to follow-up with her surgeon in [place] and has no paperwork from her hospitalization.  I reviewed medical records.  Unfortunately her medical records from [place] were not available in Care Everywhere.  I was able to review a note from June 2 from the emergency department where the patient was seen for a possible wound infection from her incision.  She was prescribed antibiotics at that time.  I ordered and interpreted images of the right knee and tib-fib.  Images showed intact ORIF from proximal tibia fracture.  There is no suture upon my examination which is able to be removed at this time.  The patient states she has trimmed sutures down to the level of the incision.  I feel this will need to be evaluated by orthopedics to determine further recommendations on possible suture removal.  She will also need orthopedics to make recommendations for weightbearing and follow-up plan.  There is no emergent condition at this time.  Plan to discharge patient home with referral to the on-call orthopedic provider for further evaluation and management as the patient is unable to return to [place] for follow-up.       Final Clinical Impression(s) / ED Diagnoses Final diagnoses:  History of open reduction and internal fixation (ORIF) procedure    Rx / DC Orders ED Discharge Orders     None         Pamala Duffel 12/04/22 1308    Pricilla Loveless, MD 12/06/22 [phone]

## 2022-12-29 ENCOUNTER — Ambulatory Visit: Payer: Medicaid Other
